# Patient Record
Sex: Male | Born: 1965
Health system: Southern US, Community
[De-identification: ages and names within clinical notes are randomized; demographics above are authoritative.]

## PROBLEM LIST (undated history)

## (undated) DIAGNOSIS — Z87442 Personal history of urinary calculi: Secondary | ICD-10-CM

## (undated) DIAGNOSIS — N200 Calculus of kidney: Secondary | ICD-10-CM

## (undated) DIAGNOSIS — I10 Essential (primary) hypertension: Secondary | ICD-10-CM

## (undated) HISTORY — PX: CHOLECYSTECTOMY: SHX55

## (undated) HISTORY — PX: OTHER SURGICAL HISTORY: SHX169

## (undated) HISTORY — PX: APPENDECTOMY: SHX54

---

## 2005-01-05 ENCOUNTER — Ambulatory Visit (HOSPITAL_COMMUNITY): Admission: RE | Admit: 2005-01-05 | Discharge: 2005-01-05 | Payer: Self-pay | Admitting: Family Medicine

## 2005-01-05 ENCOUNTER — Ambulatory Visit: Payer: Self-pay | Admitting: Family Medicine

## 2005-01-28 ENCOUNTER — Ambulatory Visit: Payer: Self-pay | Admitting: Family Medicine

## 2005-07-15 ENCOUNTER — Ambulatory Visit: Payer: Self-pay | Admitting: Family Medicine

## 2007-11-15 DIAGNOSIS — E669 Obesity, unspecified: Secondary | ICD-10-CM | POA: Insufficient documentation

## 2007-11-15 DIAGNOSIS — F528 Other sexual dysfunction not due to a substance or known physiological condition: Secondary | ICD-10-CM | POA: Insufficient documentation

## 2007-11-15 DIAGNOSIS — I10 Essential (primary) hypertension: Secondary | ICD-10-CM | POA: Insufficient documentation

## 2009-05-13 ENCOUNTER — Encounter: Payer: Self-pay | Admitting: Family Medicine

## 2009-12-25 ENCOUNTER — Ambulatory Visit (HOSPITAL_COMMUNITY): Admission: RE | Admit: 2009-12-25 | Discharge: 2009-12-25 | Payer: Self-pay | Admitting: Family Medicine

## 2010-01-09 ENCOUNTER — Emergency Department (HOSPITAL_COMMUNITY): Admission: EM | Admit: 2010-01-09 | Discharge: 2010-01-09 | Payer: Self-pay | Admitting: Emergency Medicine

## 2010-06-09 NOTE — Letter (Signed)
Summary: Historic Patient File  Historic Patient File   Imported By: Lind Guest 05/13/2009 08:28:59  _____________________________________________________________________  External Attachment:    Type:   Image     Comment:   External Document

## 2012-01-01 IMAGING — CR DG TIBIA/FIBULA 2V*L*
2 series · 2 of 2 positions shown · non-contrast
Comparison: None

CLINICAL DATA: Left lower leg trauma 6 months ago, persistent
swelling

LEFT TIBIA AND FIBULA - 2 VIEW

[view not recorded (1 of 2)]
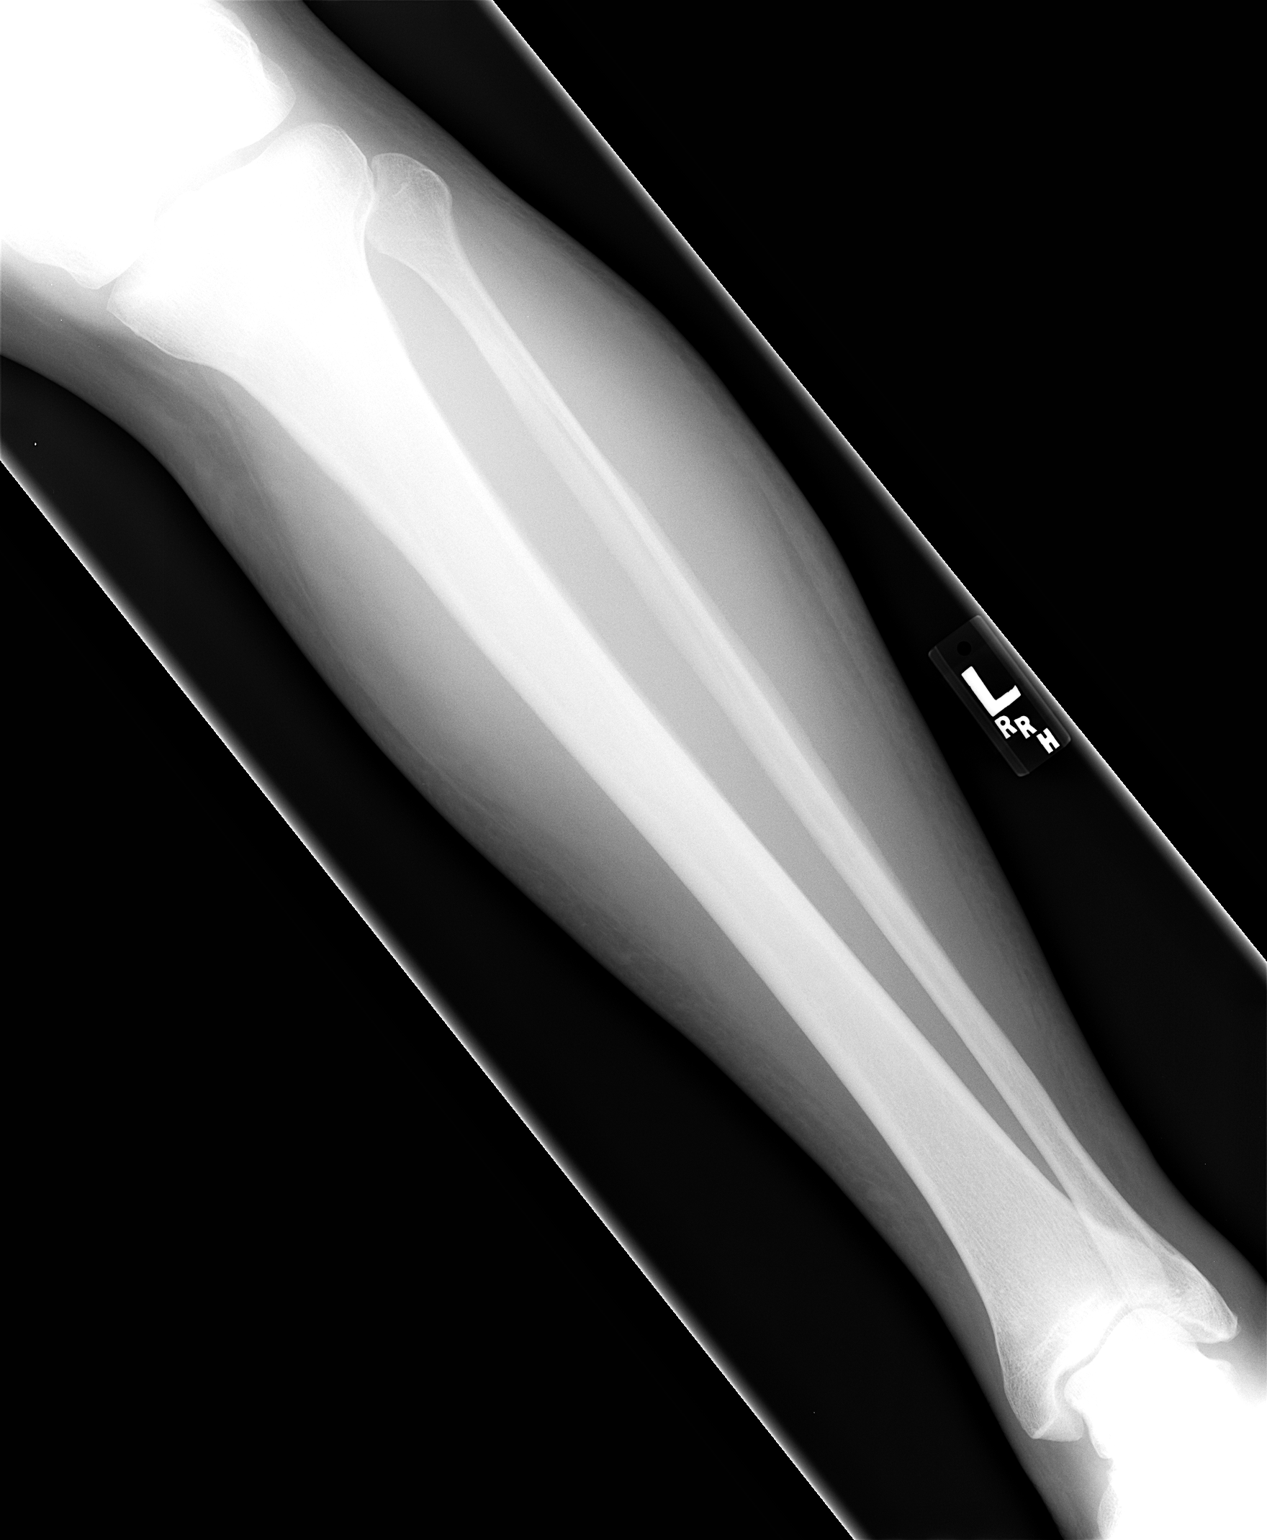

[view not recorded (2 of 2)]
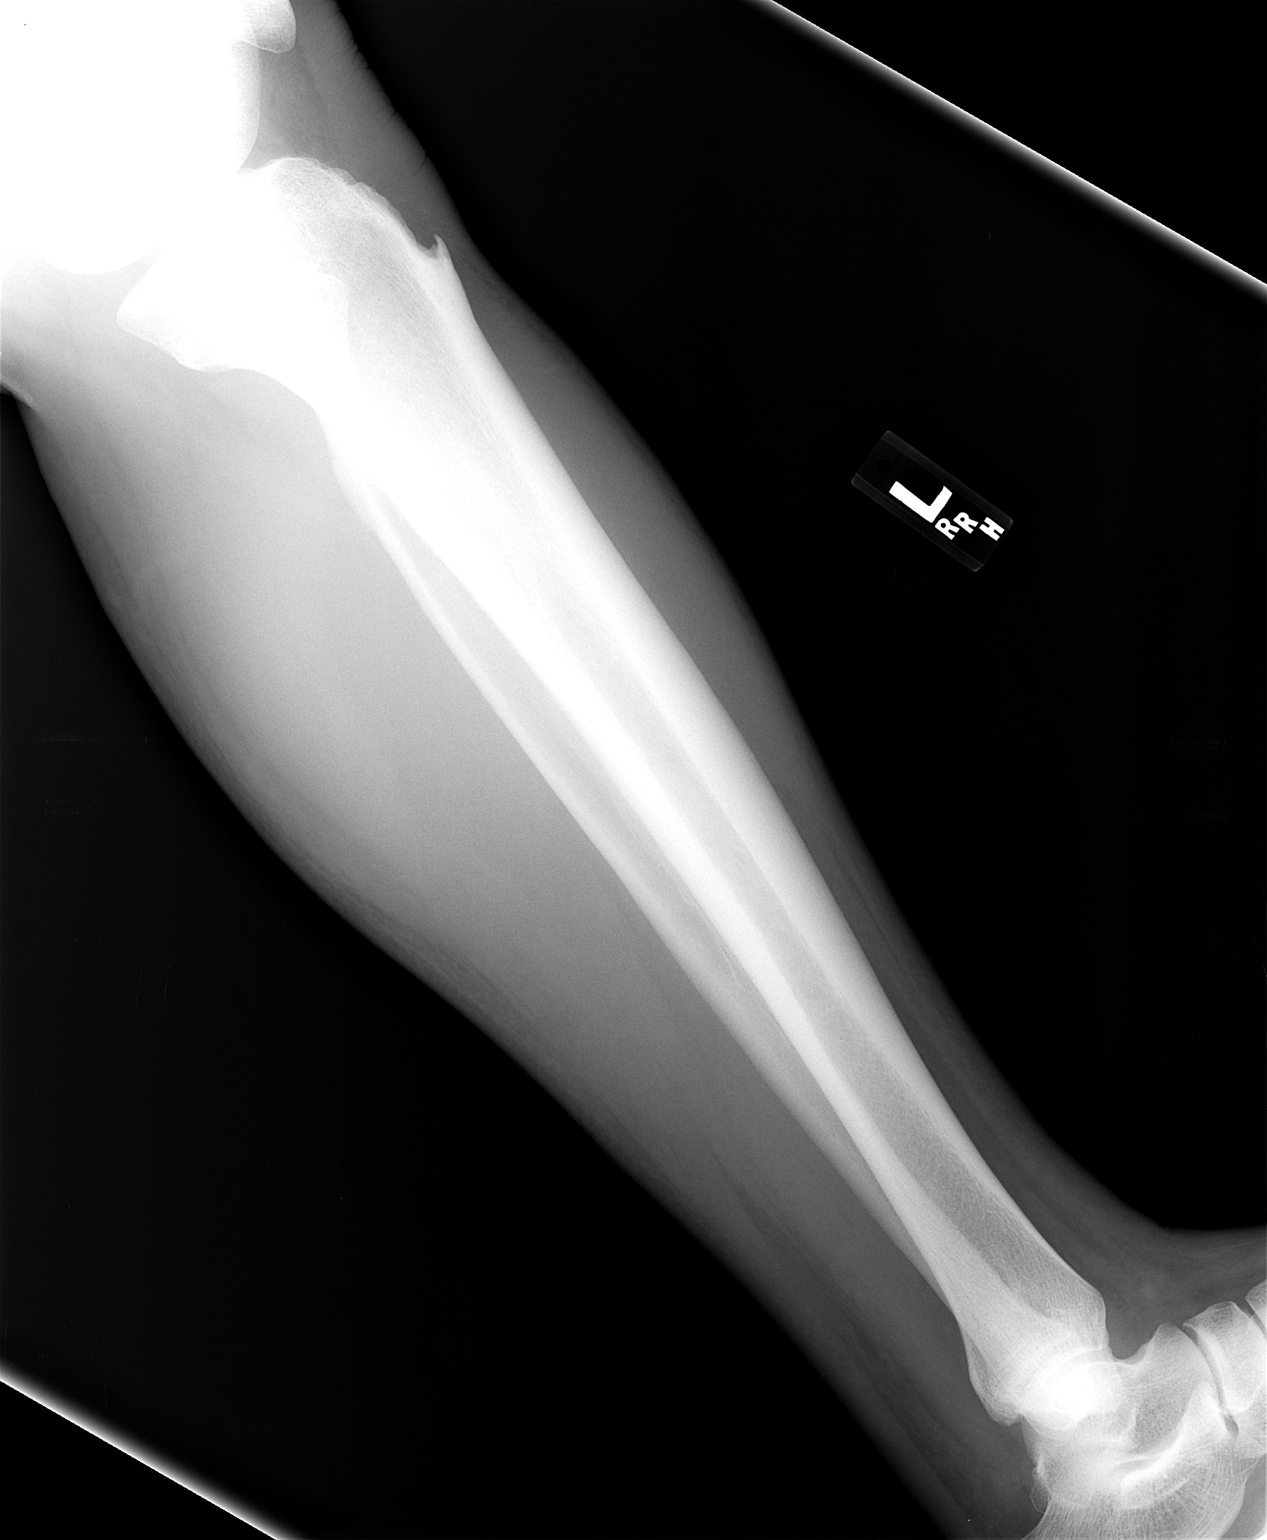

[2 of 2 positions shown; findings below may reference images not displayed]

FINDINGS: Bone mineralization normal.
Joint spaces preserved.
Minimal spurring at tibial tubercle.
No acute fracture, dislocation or bone destruction.
Questionable pretibial soft tissue swelling at proximal to mid left
lower leg.
IMPRESSION: Questionable pretibial soft tissue swelling at proximal to mid left
lower leg.
No acute bony abnormalities.

## 2016-01-16 ENCOUNTER — Ambulatory Visit (INDEPENDENT_AMBULATORY_CARE_PROVIDER_SITE_OTHER): Payer: BLUE CROSS/BLUE SHIELD | Admitting: Urology

## 2016-01-16 DIAGNOSIS — N200 Calculus of kidney: Secondary | ICD-10-CM | POA: Diagnosis not present

## 2016-01-16 DIAGNOSIS — R361 Hematospermia: Secondary | ICD-10-CM | POA: Diagnosis not present

## 2016-01-19 ENCOUNTER — Other Ambulatory Visit: Payer: Self-pay | Admitting: Urology

## 2016-01-19 ENCOUNTER — Ambulatory Visit (HOSPITAL_COMMUNITY)
Admission: RE | Admit: 2016-01-19 | Discharge: 2016-01-19 | Disposition: A | Discharge status: Home / Self Care | Payer: BLUE CROSS/BLUE SHIELD | Source: Ambulatory Visit | Attending: Urology | Admitting: Urology

## 2016-01-19 DIAGNOSIS — N200 Calculus of kidney: Secondary | ICD-10-CM | POA: Diagnosis present

## 2016-02-04 ENCOUNTER — Other Ambulatory Visit: Payer: Self-pay | Admitting: Urology

## 2016-02-16 ENCOUNTER — Ambulatory Visit (HOSPITAL_COMMUNITY): Payer: BLUE CROSS/BLUE SHIELD

## 2016-02-16 ENCOUNTER — Ambulatory Visit (HOSPITAL_COMMUNITY)
Admission: RE | Admit: 2016-02-16 | Discharge: 2016-02-16 | Disposition: A | Discharge status: Home / Self Care | Payer: BLUE CROSS/BLUE SHIELD | Source: Ambulatory Visit | Attending: Urology | Admitting: Urology

## 2016-02-16 ENCOUNTER — Encounter (HOSPITAL_COMMUNITY)
Admission: RE | Disposition: A | Discharge status: Home / Self Care | Payer: Self-pay | Source: Ambulatory Visit | Attending: Urology

## 2016-02-16 ENCOUNTER — Encounter (HOSPITAL_COMMUNITY): Payer: Self-pay | Admitting: *Deleted

## 2016-02-16 DIAGNOSIS — E669 Obesity, unspecified: Secondary | ICD-10-CM | POA: Insufficient documentation

## 2016-02-16 DIAGNOSIS — I10 Essential (primary) hypertension: Secondary | ICD-10-CM | POA: Insufficient documentation

## 2016-02-16 DIAGNOSIS — Z6841 Body Mass Index (BMI) 40.0 and over, adult: Secondary | ICD-10-CM | POA: Diagnosis not present

## 2016-02-16 DIAGNOSIS — N200 Calculus of kidney: Secondary | ICD-10-CM

## 2016-02-16 HISTORY — DX: Essential (primary) hypertension: I10

## 2016-02-16 HISTORY — DX: Personal history of urinary calculi: Z87.442

## 2016-02-16 SURGERY — LITHOTRIPSY, ESWL
Anesthesia: LOCAL | Laterality: Left

## 2016-02-16 MED ORDER — CIPROFLOXACIN HCL 500 MG PO TABS
500.0000 mg | ORAL_TABLET | ORAL | Status: AC
  Administered 2016-02-16: 500 mg via ORAL
  Filled 2016-02-16: qty 1

## 2016-02-16 MED ORDER — DIAZEPAM 5 MG PO TABS
10.0000 mg | ORAL_TABLET | ORAL | Status: AC
  Administered 2016-02-16: 10 mg via ORAL
  Filled 2016-02-16: qty 2

## 2016-02-16 MED ORDER — SODIUM CHLORIDE 0.9 % IV SOLN
INTRAVENOUS | Status: DC
  Administered 2016-02-16: 07:00:00 via INTRAVENOUS

## 2016-02-16 MED ORDER — DIPHENHYDRAMINE HCL 25 MG PO CAPS
25.0000 mg | ORAL_CAPSULE | ORAL | Status: AC
  Administered 2016-02-16: 25 mg via ORAL
  Filled 2016-02-16: qty 1

## 2016-02-16 NOTE — Discharge Instructions (Signed)
Please follow [facility] discharge instructions.  Follow up is 03/05/16 at 2:30pm.

## 2016-03-03 ENCOUNTER — Other Ambulatory Visit: Payer: Self-pay | Admitting: Urology

## 2016-03-03 ENCOUNTER — Ambulatory Visit (HOSPITAL_COMMUNITY)
Admission: RE | Admit: 2016-03-03 | Discharge: 2016-03-03 | Disposition: A | Discharge status: Home / Self Care | Payer: BLUE CROSS/BLUE SHIELD | Source: Ambulatory Visit | Attending: Urology | Admitting: Urology

## 2016-03-03 DIAGNOSIS — N200 Calculus of kidney: Secondary | ICD-10-CM

## 2016-03-05 ENCOUNTER — Ambulatory Visit (INDEPENDENT_AMBULATORY_CARE_PROVIDER_SITE_OTHER): Payer: Self-pay | Admitting: Urology

## 2016-03-05 DIAGNOSIS — N200 Calculus of kidney: Secondary | ICD-10-CM

## 2016-03-09 ENCOUNTER — Other Ambulatory Visit: Payer: Self-pay | Admitting: Urology

## 2016-03-11 ENCOUNTER — Encounter (HOSPITAL_COMMUNITY): Payer: Self-pay | Admitting: *Deleted

## 2016-03-12 NOTE — H&P (Signed)
Urology History and Physical Exam  CC: Kidney stone x HPI: 50 year old male presents for repeat ESL of an 8 mm left UPJ stone. He underwent a first ESL in October. On followup there was inadequate fragmentation/pasage. He presents fpr a retreatment.  PMH: Past Medical History:  Diagnosis Date  . History of kidney stones   . Hypertension     PSH: Past Surgical History:  Procedure Laterality Date  . APPENDECTOMY    . CHOLECYSTECTOMY      Allergies: No Known Allergies  Medications: No prescriptions prior to admission.     Social History: Social History   Social History  . Marital status: Single    Spouse name: N/A  . Number of children: N/A  . Years of education: N/A   Occupational History  . Not on file.   Social History Main Topics  . Smoking status: Never Smoker  . Smokeless tobacco: Never Used  . Alcohol use No  . Drug use: No  . Sexual activity: Not on file   Other Topics Concern  . Not on file   Social History Narrative  . No narrative on file    Family History: History reviewed. No pertinent family history.  Review of Systems: Positive: N/A Negative:  A further 10 point review of systems was negative except what is listed in the HPI.                  Physical Exam: @VITALS2 @ General: No acute distress.  Awake. Head:  Normocephalic.  Atraumatic. ENT:  EOMI.  Mucous membranes moist Neck:  Supple.  No lymphadenopathy. CV:  S1 present. S2 present. Regular rate. Pulmonary: Equal effort bilaterally.  Clear to auscultation bilaterally. Abdomen: Soft.   Non tender to palpation. Skin:  Normal turgor.  No visible rash. Extremity: No gross deformity of bilateral upper extremities.  No gross deformity of                             lower extremities. Neurologic: Alert. Appropriate mood.    Studies:  No results for input(s): HGB, WBC, PLT in the last 72 hours.  No results for input(s): NA, K, CL, CO2, BUN, CREATININE, CALCIUM, GFRNONAA, GFRAA in  the last 72 hours.  Invalid input(s): MAGNESIUM   No results for input(s): INR, APTT in the last 72 hours.  Invalid input(s): PT   Invalid input(s): ABG    Assessment:  8 mm left UPJ stone  Plan: ESL retreat

## 2016-03-15 ENCOUNTER — Encounter (HOSPITAL_COMMUNITY): Payer: Self-pay | Admitting: General Practice

## 2016-03-15 ENCOUNTER — Ambulatory Visit (HOSPITAL_COMMUNITY)
Admission: RE | Admit: 2016-03-15 | Discharge: 2016-03-15 | Disposition: A | Discharge status: Home / Self Care | Payer: BLUE CROSS/BLUE SHIELD | Source: Ambulatory Visit | Attending: Urology | Admitting: Urology

## 2016-03-15 ENCOUNTER — Encounter (HOSPITAL_COMMUNITY)
Admission: RE | Disposition: A | Discharge status: Home / Self Care | Payer: Self-pay | Source: Ambulatory Visit | Attending: Urology

## 2016-03-15 ENCOUNTER — Ambulatory Visit (HOSPITAL_COMMUNITY): Payer: BLUE CROSS/BLUE SHIELD

## 2016-03-15 DIAGNOSIS — E669 Obesity, unspecified: Secondary | ICD-10-CM | POA: Diagnosis not present

## 2016-03-15 DIAGNOSIS — Z6841 Body Mass Index (BMI) 40.0 and over, adult: Secondary | ICD-10-CM | POA: Diagnosis not present

## 2016-03-15 DIAGNOSIS — Z87442 Personal history of urinary calculi: Secondary | ICD-10-CM | POA: Diagnosis not present

## 2016-03-15 DIAGNOSIS — I10 Essential (primary) hypertension: Secondary | ICD-10-CM | POA: Insufficient documentation

## 2016-03-15 DIAGNOSIS — N200 Calculus of kidney: Secondary | ICD-10-CM | POA: Diagnosis present

## 2016-03-15 DIAGNOSIS — N201 Calculus of ureter: Secondary | ICD-10-CM

## 2016-03-15 HISTORY — DX: Calculus of kidney: N20.0

## 2016-03-15 SURGERY — LITHOTRIPSY, ESWL
Anesthesia: LOCAL | Laterality: Left

## 2016-03-15 MED ORDER — DIAZEPAM 5 MG PO TABS
10.0000 mg | ORAL_TABLET | ORAL | Status: AC
  Administered 2016-03-15: 10 mg via ORAL
  Filled 2016-03-15: qty 2

## 2016-03-15 MED ORDER — SODIUM CHLORIDE 0.9 % IV SOLN
INTRAVENOUS | Status: DC
  Administered 2016-03-15: 08:00:00 via INTRAVENOUS

## 2016-03-15 MED ORDER — CIPROFLOXACIN HCL 500 MG PO TABS
500.0000 mg | ORAL_TABLET | ORAL | Status: AC
  Administered 2016-03-15: 500 mg via ORAL
  Filled 2016-03-15: qty 1

## 2016-03-15 MED ORDER — DIPHENHYDRAMINE HCL 25 MG PO CAPS
25.0000 mg | ORAL_CAPSULE | ORAL | Status: AC
  Administered 2016-03-15: 25 mg via ORAL
  Filled 2016-03-15: qty 1

## 2016-03-15 NOTE — Op Note (Signed)
See [facility] OP note scanned into chart. 

## 2016-03-15 NOTE — Discharge Instructions (Signed)
See [facility] discharge instructions in chart.  

## 2016-04-16 ENCOUNTER — Ambulatory Visit (INDEPENDENT_AMBULATORY_CARE_PROVIDER_SITE_OTHER): Payer: Self-pay | Admitting: Urology

## 2016-04-16 ENCOUNTER — Other Ambulatory Visit: Payer: Self-pay | Admitting: Urology

## 2016-04-16 DIAGNOSIS — N200 Calculus of kidney: Secondary | ICD-10-CM

## 2016-04-19 ENCOUNTER — Ambulatory Visit (HOSPITAL_COMMUNITY): Admission: RE | Admit: 2016-04-19 | Payer: BLUE CROSS/BLUE SHIELD | Source: Ambulatory Visit

## 2016-05-21 ENCOUNTER — Ambulatory Visit: Payer: BLUE CROSS/BLUE SHIELD | Admitting: Urology

## 2016-06-28 ENCOUNTER — Ambulatory Visit (HOSPITAL_COMMUNITY)
Admission: RE | Admit: 2016-06-28 | Discharge: 2016-06-28 | Disposition: A | Discharge status: Home / Self Care | Payer: BLUE CROSS/BLUE SHIELD | Source: Ambulatory Visit | Attending: Urology | Admitting: Urology

## 2016-06-28 DIAGNOSIS — N200 Calculus of kidney: Secondary | ICD-10-CM

## 2016-07-02 ENCOUNTER — Ambulatory Visit (INDEPENDENT_AMBULATORY_CARE_PROVIDER_SITE_OTHER): Payer: BLUE CROSS/BLUE SHIELD | Admitting: Urology

## 2016-07-02 DIAGNOSIS — N200 Calculus of kidney: Secondary | ICD-10-CM

## 2016-11-05 ENCOUNTER — Encounter: Payer: Self-pay | Admitting: Family Medicine

## 2016-11-05 ENCOUNTER — Ambulatory Visit (INDEPENDENT_AMBULATORY_CARE_PROVIDER_SITE_OTHER): Payer: BLUE CROSS/BLUE SHIELD | Admitting: Family Medicine

## 2016-11-05 VITALS — BP 178/128 | HR 88 | Temp 98.0°F | Resp 20 | Ht 67.0 in | Wt 264.1 lb

## 2016-11-05 DIAGNOSIS — N200 Calculus of kidney: Secondary | ICD-10-CM | POA: Diagnosis not present

## 2016-11-05 DIAGNOSIS — Z6841 Body Mass Index (BMI) 40.0 and over, adult: Secondary | ICD-10-CM | POA: Diagnosis not present

## 2016-11-05 DIAGNOSIS — I10 Essential (primary) hypertension: Secondary | ICD-10-CM | POA: Diagnosis not present

## 2016-11-05 DIAGNOSIS — Z1211 Encounter for screening for malignant neoplasm of colon: Secondary | ICD-10-CM

## 2016-11-05 DIAGNOSIS — F528 Other sexual dysfunction not due to a substance or known physiological condition: Secondary | ICD-10-CM | POA: Diagnosis not present

## 2016-11-05 DIAGNOSIS — R361 Hematospermia: Secondary | ICD-10-CM | POA: Diagnosis not present

## 2016-11-05 HISTORY — DX: Calculus of kidney: N20.0

## 2016-11-05 LAB — CBC
HEMATOCRIT: 47.1 % (ref 38.5–50.0)
Hemoglobin: 15.2 g/dL (ref 13.2–17.1)
MCH: 28.8 pg (ref 27.0–33.0)
MCHC: 32.3 g/dL (ref 32.0–36.0)
MCV: 89.2 fL (ref 80.0–100.0)
MPV: 10.1 fL (ref 7.5–12.5)
PLATELETS: 209 10*3/uL (ref 140–400)
RBC: 5.28 MIL/uL (ref 4.20–5.80)
RDW: 14.2 % (ref 11.0–15.0)
WBC: 8.6 10*3/uL (ref 3.8–10.8)

## 2016-11-05 LAB — PSA: PSA: 0.4 ng/mL (ref <=4.0)

## 2016-11-05 MED ORDER — AMLODIPINE BESYLATE 5 MG PO TABS: 5.0000 mg | ORAL_TABLET | Freq: Every day | ORAL | 3 refills | Status: DC

## 2016-11-05 MED ORDER — LOSARTAN POTASSIUM-HCTZ 50-12.5 MG PO TABS: 1.0000 | ORAL_TABLET | Freq: Every day | ORAL | 3 refills | Status: DC

## 2016-11-05 NOTE — Progress Notes (Signed)
Chief Complaint  Patient presents with  . Hypertension   Has not seen a PCP recently Out of medicines Sees urology Dr Jeffie Pollock for kidney stones, ED and recently blood in semen No headache or dizziness off of the BP medicine - although his BP is high Didn't like prior meds due to cough on lisinopril, and his dentist told him to get off the atenolol.  Reason?Marland Kitchen No recent PE Never had colon cancer screen Discussed pros and cons of prostate cancer screening and he chooses to have pSA No recent eye or dental care Discussed his weight.  He would like to lose 60 lbs.  We reviewed diet, portions. calories and snacking.  recommend regular exercise.   Patient Active Problem List   Diagnosis Date Noted  . Kidney stones 11/05/2016  . OBESITY 11/15/2007  . ERECTILE DYSFUNCTION 11/15/2007  . Essential hypertension 11/15/2007    Outpatient Encounter Prescriptions as of 11/05/2016  Medication Sig  . amLODipine (NORVASC) 5 MG tablet Take 1 tablet (5 mg total) by mouth daily.  Marland Kitchen losartan-hydrochlorothiazide (HYZAAR) 50-12.5 MG tablet Take 1 tablet by mouth daily.   No facility-administered encounter medications on file as of 11/05/2016.     Past Medical History:  Diagnosis Date  . History of kidney stones   . Hypertension   . Kidney stones     Past Surgical History:  Procedure Laterality Date  . APPENDECTOMY    . CHOLECYSTECTOMY    . lithrotripsy      Social History   Social History  . Marital status: Significant Other    Spouse name: Pam  . Number of children: 1  . Years of education: 81   Occupational History  . Engineer, building services    Social History Main Topics  . Smoking status: Never Smoker  . Smokeless tobacco: Never Used  . Alcohol use No  . Drug use: No  . Sexual activity: Yes    Birth control/ protection: None   Other Topics Concern  . 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 COPD Mother   . Hypertension Mother   . Alcohol abuse Mother   . Asthma Mother   . Heart disease Mother        died at 36  . Hypertension Father   . Stroke Sister   . Alcohol abuse Sister   . Cancer Brother   . Early death Brother        homicide    Review of Systems  Constitutional: Negative for chills, fever and weight loss.  HENT: Negative for congestion and hearing loss.   Eyes: Negative for blurred vision and pain.  Respiratory: Negative for cough and shortness of breath.   Cardiovascular: Negative for chest pain and leg swelling.  Gastrointestinal: Negative for abdominal pain, constipation, diarrhea and heartburn.  Genitourinary: Negative for dysuria and frequency.  Musculoskeletal: Negative for falls, joint pain and myalgias.  Neurological: Negative for dizziness, seizures and headaches.  Psychiatric/Behavioral: Negative for depression. The patient is not nervous/anxious and does not have insomnia.     BP (!) 178/128 (BP Location: Right Arm, Patient Position: Sitting, Cuff Size: Large)   Pulse 88   Temp 98 F (36.7 C) (Temporal)   Resp 20   Ht 5\' 7"  (1.702 m)   Wt 264  lb 1.9 oz (119.8 kg)   SpO2 95%   BMI 41.37 kg/m   Physical Exam  Constitutional: He is oriented to person, place, and time. He appears well-developed and well-nourished.  HENT:  Head: Normocephalic and atraumatic.  Mouth/Throat: Oropharynx is clear and moist.  Eyes: Conjunctivae are normal. Pupils are equal, round, and reactive to light.  Neck: Normal range of motion. Neck supple.  Cardiovascular: Normal rate, regular rhythm and normal heart sounds.   Pulmonary/Chest: Effort normal and breath sounds normal. No respiratory distress.  Abdominal:  obese  Musculoskeletal: Normal range of motion. He exhibits no edema.  Neurological: He is alert and oriented to person, place, and time.  Gait normal  Skin:  Skin is warm and dry.  Psychiatric: He has a normal mood and affect. His behavior is normal. Thought content normal.  Nursing note and vitals reviewed.  ASSESSMENT/PLAN:  1. Kidney stones  2. Essential hypertension - CBC - COMPLETE METABOLIC PANEL WITH GFR - Hemoglobin A1c - Lipid panel - VITAMIN D 25 Hydroxy (Vit-D Deficiency, Fractures) - Urinalysis, Routine w reflex microscopic  3. ERECTILE DYSFUNCTION  4. Class 3 severe obesity due to excess calories with serious comorbidity and body mass index (BMI) of 40.0 to 44.9 in adult [facility]) educated  5. Hematospermia - PSA  6. Colon cancer screening - Ambulatory referral to Gastroenterology   Greater than 50% of this visit was spent in counseling and coordinating care.  Total face to face time:  46min in health education re obesity and the importance of BP medication compliance  Patient Instructions  Take the BP medicines daily Drink more water Can drink flavored water or flavored seltzer Walk every day tht you are aable  need blood work today  Need colon cancer screening  Come back for BP check in a week or two See me for a PE in a month   Raylene Everts, MD

## 2016-11-05 NOTE — Patient Instructions (Addendum)
Take the BP medicines daily Drink more water Can drink flavored water or flavored seltzer Walk every day tht you are aable  need blood work today  Need colon cancer screening  Come back for BP check in a week or two See me for a PE in a month

## 2016-11-06 LAB — LIPID PANEL
CHOL/HDL RATIO: 2.5 ratio (ref <5.0)
Cholesterol: 133 mg/dL (ref <200)
HDL: 54 mg/dL (ref >40)
LDL CALC: 69 mg/dL (ref <100)
Triglycerides: 50 mg/dL (ref <150)
VLDL: 10 mg/dL (ref <30)

## 2016-11-06 LAB — COMPLETE METABOLIC PANEL WITH GFR
ALT: 26 U/L (ref 9–46)
AST: 17 U/L (ref 10–35)
Albumin: 4.3 g/dL (ref 3.6–5.1)
Alkaline Phosphatase: 77 U/L (ref 40–115)
BILIRUBIN TOTAL: 0.4 mg/dL (ref 0.2–1.2)
BUN: 15 mg/dL (ref 7–25)
CO2: 29 mmol/L (ref 20–31)
CREATININE: 1.11 mg/dL (ref 0.70–1.33)
Calcium: 9.1 mg/dL (ref 8.6–10.3)
Chloride: 102 mmol/L (ref 98–110)
GFR, EST AFRICAN AMERICAN: 88 mL/min (ref >=60)
GFR, Est Non African American: 76 mL/min (ref >=60)
Glucose, Bld: 85 mg/dL (ref 65–99)
Potassium: 4.3 mmol/L (ref 3.5–5.3)
Sodium: 138 mmol/L (ref 135–146)
TOTAL PROTEIN: 7.1 g/dL (ref 6.1–8.1)

## 2016-11-06 LAB — URINALYSIS, ROUTINE W REFLEX MICROSCOPIC
BILIRUBIN URINE: NEGATIVE
Glucose, UA: NEGATIVE
HGB URINE DIPSTICK: NEGATIVE
KETONES UR: NEGATIVE
Leukocytes, UA: NEGATIVE
Nitrite: NEGATIVE
Specific Gravity, Urine: 1.021 (ref 1.001–1.035)
pH: 6 (ref 5.0–8.0)

## 2016-11-06 LAB — HEMOGLOBIN A1C
Hgb A1c MFr Bld: 5.7 % — ABNORMAL HIGH (ref <5.7)
MEAN PLASMA GLUCOSE: 117 mg/dL

## 2016-11-06 LAB — VITAMIN D 25 HYDROXY (VIT D DEFICIENCY, FRACTURES): VIT D 25 HYDROXY: 25 ng/mL — AB (ref 30–100)

## 2016-11-08 ENCOUNTER — Encounter (INDEPENDENT_AMBULATORY_CARE_PROVIDER_SITE_OTHER): Payer: Self-pay | Admitting: *Deleted

## 2016-11-11 ENCOUNTER — Encounter: Payer: Self-pay | Admitting: Family Medicine

## 2016-11-19 ENCOUNTER — Ambulatory Visit: Payer: BLUE CROSS/BLUE SHIELD

## 2016-11-19 VITALS — BP 138/96

## 2016-11-19 DIAGNOSIS — I10 Essential (primary) hypertension: Secondary | ICD-10-CM

## 2016-11-19 NOTE — Progress Notes (Signed)
Patient took BP meds roughly two hours prior to BP check, but did have Dr Malachi Bonds right before coming in.

## 2016-12-07 ENCOUNTER — Ambulatory Visit (INDEPENDENT_AMBULATORY_CARE_PROVIDER_SITE_OTHER): Payer: BLUE CROSS/BLUE SHIELD | Admitting: Family Medicine

## 2016-12-07 ENCOUNTER — Other Ambulatory Visit: Payer: Self-pay

## 2016-12-07 ENCOUNTER — Encounter: Payer: Self-pay | Admitting: Family Medicine

## 2016-12-07 VITALS — BP 138/88 | HR 100 | Temp 97.7°F | Resp 16 | Ht 66.5 in | Wt 266.2 lb

## 2016-12-07 DIAGNOSIS — I10 Essential (primary) hypertension: Secondary | ICD-10-CM

## 2016-12-07 DIAGNOSIS — E559 Vitamin D deficiency, unspecified: Secondary | ICD-10-CM | POA: Diagnosis not present

## 2016-12-07 DIAGNOSIS — R7303 Prediabetes: Secondary | ICD-10-CM

## 2016-12-07 DIAGNOSIS — Z0000 Encounter for general adult medical examination without abnormal findings: Secondary | ICD-10-CM

## 2016-12-07 DIAGNOSIS — S61501A Unspecified open wound of right wrist, initial encounter: Secondary | ICD-10-CM

## 2016-12-07 NOTE — Patient Instructions (Addendum)
You need to reduce the sweets and carbohydrates in your diet I will check you sugar in 6 months Try to lose weight 1-2 pounds a week Walk every day that you are able We will discuss nutrition consult if you are unable to lose weight on your own        DASH Eating Plan DASH stands for "Dietary Approaches to Stop Hypertension." The DASH eating plan is a healthy eating plan that has been shown to reduce high blood pressure (hypertension). It may also reduce your risk for type 2 diabetes, heart disease, and stroke. The DASH eating plan may also help with weight loss. What are tips for following this plan? General guidelines  Avoid eating more than 2,300 mg (milligrams) of salt (sodium) a day. If you have hypertension, you may need to reduce your sodium intake to 1,500 mg a day.  Limit alcohol intake to no more than 1 drink a day for nonpregnant women and 2 drinks a day for men. One drink equals 12 oz of beer, 5 oz of wine, or 1 oz of hard liquor.  Work with your health care provider to maintain a healthy body weight or to lose weight. Ask what an ideal weight is for you.  Get at least 30 minutes of exercise that causes your heart to beat faster (aerobic exercise) most days of the week. Activities may include walking, swimming, or biking.  Work with your health care provider or diet and nutrition specialist (dietitian) to adjust your eating plan to your individual calorie needs. Reading food labels  Check food labels for the amount of sodium per serving. Choose foods with less than 5 percent of the Daily Value of sodium. Generally, foods with less than 300 mg of sodium per serving fit into this eating plan.  To find whole grains, look for the word "whole" as the first word in the ingredient list. Shopping  Buy products labeled as "low-sodium" or "no salt added."  Buy fresh foods. Avoid canned foods and premade or frozen meals. Cooking  Avoid adding salt when cooking. Use salt-free  seasonings or herbs instead of table salt or sea salt. Check with your health care provider or pharmacist before using salt substitutes.  Do not fry foods. Cook foods using healthy methods such as baking, boiling, grilling, and broiling instead.  Cook with heart-healthy oils, such as olive, canola, soybean, or sunflower oil. Meal planning   Eat a balanced diet that includes: ? 5 or more servings of fruits and vegetables each day. At each meal, try to fill half of your plate with fruits and vegetables. ? Up to 6-8 servings of whole grains each day. ? Less than 6 oz of lean meat, poultry, or fish each day. A 3-oz serving of meat is about the same size as a deck of cards. One egg equals 1 oz. ? 2 servings of low-fat dairy each day. ? A serving of nuts, seeds, or beans 5 times each week. ? Heart-healthy fats. Healthy fats called Omega-3 fatty acids are found in foods such as flaxseeds and coldwater fish, like sardines, salmon, and mackerel.  Limit how much you eat of the following: ? Canned or prepackaged foods. ? Food that is high in trans fat, such as fried foods. ? Food that is high in saturated fat, such as fatty meat. ? Sweets, desserts, sugary drinks, and other foods with added sugar. ? Full-fat dairy products.  Do not salt foods before eating.  Try to eat at least  2 vegetarian meals each week.  Eat more home-cooked food and less restaurant, buffet, and fast food.  When eating at a restaurant, ask that your food be prepared with less salt or no salt, if possible. What foods are recommended? The items listed may not be a complete list. Talk with your dietitian about what dietary choices are best for you. Grains Whole-grain or whole-wheat bread. Whole-grain or whole-wheat pasta. Brown rice. [place]. Bulgur. Whole-grain and low-sodium cereals. Pita bread. Low-fat, low-sodium crackers. Whole-wheat flour tortillas. Vegetables Fresh or frozen vegetables (raw, steamed, roasted,  or grilled). Low-sodium or reduced-sodium tomato and vegetable juice. Low-sodium or reduced-sodium tomato sauce and tomato paste. Low-sodium or reduced-sodium canned vegetables. Fruits All fresh, dried, or frozen fruit. Canned fruit in natural juice (without added sugar). Meat and other protein foods Skinless chicken or [place]. Ground chicken or [place]. Pork with fat trimmed off. Fish and seafood. Egg whites. Dried beans, peas, or lentils. Unsalted nuts, nut butters, and seeds. Unsalted canned beans. Lean cuts of beef with fat trimmed off. Low-sodium, lean deli meat. Dairy Low-fat (1%) or fat-free (skim) milk. Fat-free, low-fat, or reduced-fat cheeses. Nonfat, low-sodium ricotta or cottage cheese. Low-fat or nonfat yogurt. Low-fat, low-sodium cheese. Fats and oils Soft margarine without trans fats. Vegetable oil. Low-fat, reduced-fat, or light mayonnaise and salad dressings (reduced-sodium). Canola, safflower, olive, soybean, and sunflower oils. Avocado. Seasoning and other foods Herbs. Spices. Seasoning mixes without salt. Unsalted popcorn and pretzels. Fat-free sweets. What foods are not recommended? The items listed may not be a complete list. Talk with your dietitian about what dietary choices are best for you. Grains Baked goods made with fat, such as croissants, muffins, or some breads. Dry pasta or rice meal packs. Vegetables Creamed or fried vegetables. Vegetables in a cheese sauce. Regular canned vegetables (not low-sodium or reduced-sodium). Regular canned tomato sauce and paste (not low-sodium or reduced-sodium). Regular tomato and vegetable juice (not low-sodium or reduced-sodium). Angie Fava. Olives. Fruits Canned fruit in a light or heavy syrup. Fried fruit. Fruit in cream or butter sauce. Meat and other protein foods Fatty cuts of meat. Ribs. Fried meat. Berniece Salines. Sausage. Bologna and other processed lunch meats. Salami. Fatback. Hotdogs. Bratwurst. Salted nuts and seeds. Canned beans  with added salt. Canned or smoked fish. Whole eggs or egg yolks. Chicken or [place] with skin. Dairy Whole or 2% milk, cream, and half-and-half. Whole or full-fat cream cheese. Whole-fat or sweetened yogurt. Full-fat cheese. Nondairy creamers. Whipped toppings. Processed cheese and cheese spreads. Fats and oils Butter. Stick margarine. Lard. Shortening. Ghee. Bacon fat. Tropical oils, such as coconut, palm kernel, or palm oil. Seasoning and other foods Salted popcorn and pretzels. Onion salt, garlic salt, seasoned salt, table salt, and sea salt. Worcestershire sauce. Tartar sauce. Barbecue sauce. Teriyaki sauce. Soy sauce, including reduced-sodium. Steak sauce. Canned and packaged gravies. Fish sauce. Oyster sauce. Cocktail sauce. Horseradish that you find on the shelf. Ketchup. Mustard. Meat flavorings and tenderizers. Bouillon cubes. Hot sauce and Tabasco sauce. Premade or packaged marinades. Premade or packaged taco seasonings. Relishes. Regular salad dressings. Where to find more information:  National Heart, Lung, and [place]: https://wilson-eaton.com/  American Heart Association: www.heart.org Summary  The DASH eating plan is a healthy eating plan that has been shown to reduce high blood pressure (hypertension). It may also reduce your risk for type 2 diabetes, heart disease, and stroke.  With the DASH eating plan, you should limit salt (sodium) intake to 2,300 mg a day. If you have hypertension, you may  need to reduce your sodium intake to 1,500 mg a day.  When on the DASH eating plan, aim to eat more fresh fruits and vegetables, whole grains, lean proteins, low-fat dairy, and heart-healthy fats.  Work with your health care provider or diet and nutrition specialist (dietitian) to adjust your eating plan to your individual calorie needs. This information is not intended to replace advice given to you by your health care provider. Make sure you discuss any questions you have with your health  care provider. Document Released: 04/15/2011 Document Revised: 04/19/2016 Document Reviewed: 04/19/2016 Elsevier Interactive Patient Education  2017 [employer].

## 2016-12-07 NOTE — Progress Notes (Signed)
Chief Complaint  Patient presents with  . Annual Exam   Here for PE He will call for colonoscopy, he has letter from GI Recent labs reviewed.  Cholesterol reviewed.  Has pre diabetes.  Discussed diet, sweets, carbohydrates and portions.  Reviewed the importance of exercising every day.   BP controlled   Patient Active Problem List   Diagnosis Date Noted  . Prediabetes 12/07/2016  . Vitamin D deficiency 12/07/2016  . Kidney stones 11/05/2016  . OBESITY 11/15/2007  . ERECTILE DYSFUNCTION 11/15/2007  . Essential hypertension 11/15/2007    Outpatient Encounter Prescriptions as of 12/07/2016  Medication Sig  . amLODipine (NORVASC) 5 MG tablet Take 1 tablet (5 mg total) by mouth daily.  Marland Kitchen losartan-hydrochlorothiazide (HYZAAR) 50-12.5 MG tablet Take 1 tablet by mouth daily.   No facility-administered encounter medications on file as of 12/07/2016.     Allergies  Allergen Reactions  . Lisinopril Cough    Review of Systems  Constitutional: Negative for activity change, appetite change and unexpected weight change.  HENT: Negative for congestion, dental problem and trouble swallowing.   Eyes: Negative for photophobia and visual disturbance.  Respiratory: Negative for cough, shortness of breath and wheezing.   Cardiovascular: Negative for chest pain, palpitations and leg swelling.  Gastrointestinal: Negative for constipation and diarrhea.  Genitourinary: Negative for difficulty urinating and frequency.  Musculoskeletal: Negative for arthralgias and back pain.  Neurological: Negative for dizziness and headaches.  Psychiatric/Behavioral: Negative for sleep disturbance. The patient is not nervous/anxious.    Physical Exam  BP 138/88   Pulse 100   Temp 97.7 F (36.5 C) (Other (Comment))   Resp 16   Ht 5' 6.5" (1.689 m)   Wt 266 lb 4 oz (120.8 kg)   SpO2 94%   BMI 42.33 kg/m   General Appearance:    Alert, cooperative, no distress, appears stated age.  obese  Head:     Normocephalic, without obvious abnormality, atraumatic  Eyes:    PERRL, conjunctiva/corneas clear, EOM's intact, fundi    benign, both eyes       Ears:    Normal TM's and external ear canals, both ears  Nose:   Nares normal, septum midline, mucosa normal, no drainage   or sinus tenderness  Throat:   Lips, mucosa, and tongue normal; teeth and gums normal  Neck:   Supple, symmetrical, trachea midline, no adenopathy;       thyroid:  No enlargement/tenderness/nodules; no carotid   bruit   Back:     Symmetric, no curvature, ROM normal, no CVA tenderness  Lungs:     Clear to auscultation bilaterally, respirations unlabored  Chest wall:    No tenderness or deformity  Heart:    Regular rate and rhythm, S1 and S2 normal, no murmur, rub   or gallop  Abdomen:     Soft, non-tender, bowel sounds active all four quadrants,    no masses, no organomegaly. Obese.  Well healed appy scar  Genitalia:    Normal male without lesion, discharge or tenderness  Extremities:   Extremities normal, atraumatic, no cyanosis or edema  Pulses:   2+ and symmetric all extremities  Skin:   Skin color, texture, turgor normal, no rashes or lesions.  Skin peeling feet ?tinea  Lymph nodes:   Cervical, supraclavicular, and axillary nodes normal  Neurologic:   Normal strength, sensation and reflexes      throughout     ASSESSMENT/PLAN:  1. Prediabetes DIET - Hemoglobin A1c  2.  Vitamin D deficiency discussed  3. Essential hypertension No LVH - EKG 12-Lead  4. Annual physical exam complete  5. Open wound of right wrist, initial encounter Need Tdap  Patient Instructions  You need to reduce the sweets and carbohydrates in your diet I will check you sugar in 6 months Try to lose weight 1-2 pounds a week Walk every day that you are able We will discuss nutrition consult if you are unable to lose weight on your own        DASH Eating Plan DASH stands for "Dietary Approaches to Stop Hypertension." The DASH  eating plan is a healthy eating plan that has been shown to reduce high blood pressure (hypertension). It may also reduce your risk for type 2 diabetes, heart disease, and stroke. The DASH eating plan may also help with weight loss. What are tips for following this plan? General guidelines  Avoid eating more than 2,300 mg (milligrams) of salt (sodium) a day. If you have hypertension, you may need to reduce your sodium intake to 1,500 mg a day.  Limit alcohol intake to no more than 1 drink a day for nonpregnant women and 2 drinks a day for men. One drink equals 12 oz of beer, 5 oz of wine, or 1 oz of hard liquor.  Work with your health care provider to maintain a healthy body weight or to lose weight. Ask what an ideal weight is for you.  Get at least 30 minutes of exercise that causes your heart to beat faster (aerobic exercise) most days of the week. Activities may include walking, swimming, or biking.  Work with your health care provider or diet and nutrition specialist (dietitian) to adjust your eating plan to your individual calorie needs. Reading food labels  Check food labels for the amount of sodium per serving. Choose foods with less than 5 percent of the Daily Value of sodium. Generally, foods with less than 300 mg of sodium per serving fit into this eating plan.  To find whole grains, look for the word "whole" as the first word in the ingredient list. Shopping  Buy products labeled as "low-sodium" or "no salt added."  Buy fresh foods. Avoid canned foods and premade or frozen meals. Cooking  Avoid adding salt when cooking. Use salt-free seasonings or herbs instead of table salt or sea salt. Check with your health care provider or pharmacist before using salt substitutes.  Do not fry foods. Cook foods using healthy methods such as baking, boiling, grilling, and broiling instead.  Cook with heart-healthy oils, such as olive, canola, soybean, or sunflower oil. Meal  planning   Eat a balanced diet that includes: ? 5 or more servings of fruits and vegetables each day. At each meal, try to fill half of your plate with fruits and vegetables. ? Up to 6-8 servings of whole grains each day. ? Less than 6 oz of lean meat, poultry, or fish each day. A 3-oz serving of meat is about the same size as a deck of cards. One egg equals 1 oz. ? 2 servings of low-fat dairy each day. ? A serving of nuts, seeds, or beans 5 times each week. ? Heart-healthy fats. Healthy fats called Omega-3 fatty acids are found in foods such as flaxseeds and coldwater fish, like sardines, salmon, and mackerel.  Limit how much you eat of the following: ? Canned or prepackaged foods. ? Food that is high in trans fat, such as fried foods. ? Food that is high  in saturated fat, such as fatty meat. ? Sweets, desserts, sugary drinks, and other foods with added sugar. ? Full-fat dairy products.  Do not salt foods before eating.  Try to eat at least 2 vegetarian meals each week.  Eat more home-cooked food and less restaurant, buffet, and fast food.  When eating at a restaurant, ask that your food be prepared with less salt or no salt, if possible. What foods are recommended? The items listed may not be a complete list. Talk with your dietitian about what dietary choices are best for you. Grains Whole-grain or whole-wheat bread. Whole-grain or whole-wheat pasta. Brown rice. [place]. Bulgur. Whole-grain and low-sodium cereals. Pita bread. Low-fat, low-sodium crackers. Whole-wheat flour tortillas. Vegetables Fresh or frozen vegetables (raw, steamed, roasted, or grilled). Low-sodium or reduced-sodium tomato and vegetable juice. Low-sodium or reduced-sodium tomato sauce and tomato paste. Low-sodium or reduced-sodium canned vegetables. Fruits All fresh, dried, or frozen fruit. Canned fruit in natural juice (without added sugar). Meat and other protein foods Skinless chicken or [place].  Ground chicken or [place]. Pork with fat trimmed off. Fish and seafood. Egg whites. Dried beans, peas, or lentils. Unsalted nuts, nut butters, and seeds. Unsalted canned beans. Lean cuts of beef with fat trimmed off. Low-sodium, lean deli meat. Dairy Low-fat (1%) or fat-free (skim) milk. Fat-free, low-fat, or reduced-fat cheeses. Nonfat, low-sodium ricotta or cottage cheese. Low-fat or nonfat yogurt. Low-fat, low-sodium cheese. Fats and oils Soft margarine without trans fats. Vegetable oil. Low-fat, reduced-fat, or light mayonnaise and salad dressings (reduced-sodium). Canola, safflower, olive, soybean, and sunflower oils. Avocado. Seasoning and other foods Herbs. Spices. Seasoning mixes without salt. Unsalted popcorn and pretzels. Fat-free sweets. What foods are not recommended? The items listed may not be a complete list. Talk with your dietitian about what dietary choices are best for you. Grains Baked goods made with fat, such as croissants, muffins, or some breads. Dry pasta or rice meal packs. Vegetables Creamed or fried vegetables. Vegetables in a cheese sauce. Regular canned vegetables (not low-sodium or reduced-sodium). Regular canned tomato sauce and paste (not low-sodium or reduced-sodium). Regular tomato and vegetable juice (not low-sodium or reduced-sodium). Angie Fava. Olives. Fruits Canned fruit in a light or heavy syrup. Fried fruit. Fruit in cream or butter sauce. Meat and other protein foods Fatty cuts of meat. Ribs. Fried meat. Berniece Salines. Sausage. Bologna and other processed lunch meats. Salami. Fatback. Hotdogs. Bratwurst. Salted nuts and seeds. Canned beans with added salt. Canned or smoked fish. Whole eggs or egg yolks. Chicken or [place] with skin. Dairy Whole or 2% milk, cream, and half-and-half. Whole or full-fat cream cheese. Whole-fat or sweetened yogurt. Full-fat cheese. Nondairy creamers. Whipped toppings. Processed cheese and cheese spreads. Fats and oils Butter. Stick  margarine. Lard. Shortening. Ghee. Bacon fat. Tropical oils, such as coconut, palm kernel, or palm oil. Seasoning and other foods Salted popcorn and pretzels. Onion salt, garlic salt, seasoned salt, table salt, and sea salt. Worcestershire sauce. Tartar sauce. Barbecue sauce. Teriyaki sauce. Soy sauce, including reduced-sodium. Steak sauce. Canned and packaged gravies. Fish sauce. Oyster sauce. Cocktail sauce. Horseradish that you find on the shelf. Ketchup. Mustard. Meat flavorings and tenderizers. Bouillon cubes. Hot sauce and Tabasco sauce. Premade or packaged marinades. Premade or packaged taco seasonings. Relishes. Regular salad dressings. Where to find more information:  National Heart, Lung, and [place]: https://wilson-eaton.com/  American Heart Association: www.heart.org Summary  The DASH eating plan is a healthy eating plan that has been shown to reduce high blood pressure (hypertension). It may  also reduce your risk for type 2 diabetes, heart disease, and stroke.  With the DASH eating plan, you should limit salt (sodium) intake to 2,300 mg a day. If you have hypertension, you may need to reduce your sodium intake to 1,500 mg a day.  When on the DASH eating plan, aim to eat more fresh fruits and vegetables, whole grains, lean proteins, low-fat dairy, and heart-healthy fats.  Work with your health care provider or diet and nutrition specialist (dietitian) to adjust your eating plan to your individual calorie needs. This information is not intended to replace advice given to you by your health care provider. Make sure you discuss any questions you have with your health care provider. Document Released: 04/15/2011 Document Revised: 04/19/2016 Document Reviewed: 04/19/2016 Elsevier Interactive Patient Education  2017 Elsevier Inc.      Raylene Everts, MD

## 2016-12-10 ENCOUNTER — Ambulatory Visit: Payer: BLUE CROSS/BLUE SHIELD | Admitting: Urology

## 2017-06-08 ENCOUNTER — Ambulatory Visit: Payer: BLUE CROSS/BLUE SHIELD | Admitting: Family Medicine

## 2017-07-18 ENCOUNTER — Encounter: Payer: Self-pay | Admitting: Family Medicine

## 2017-11-27 ENCOUNTER — Other Ambulatory Visit: Payer: Self-pay | Admitting: Family Medicine

## 2018-02-22 IMAGING — CR DG ABDOMEN 1V
2 series · 2 of 2 positions shown · non-contrast
Comparison: Radiographs 01/19/2016

CLINICAL DATA: Left nephrolithiasis pre lithotripsy.

EXAM:
ABDOMEN - 1 VIEW

[t abdomen supine * (1 of 2)]
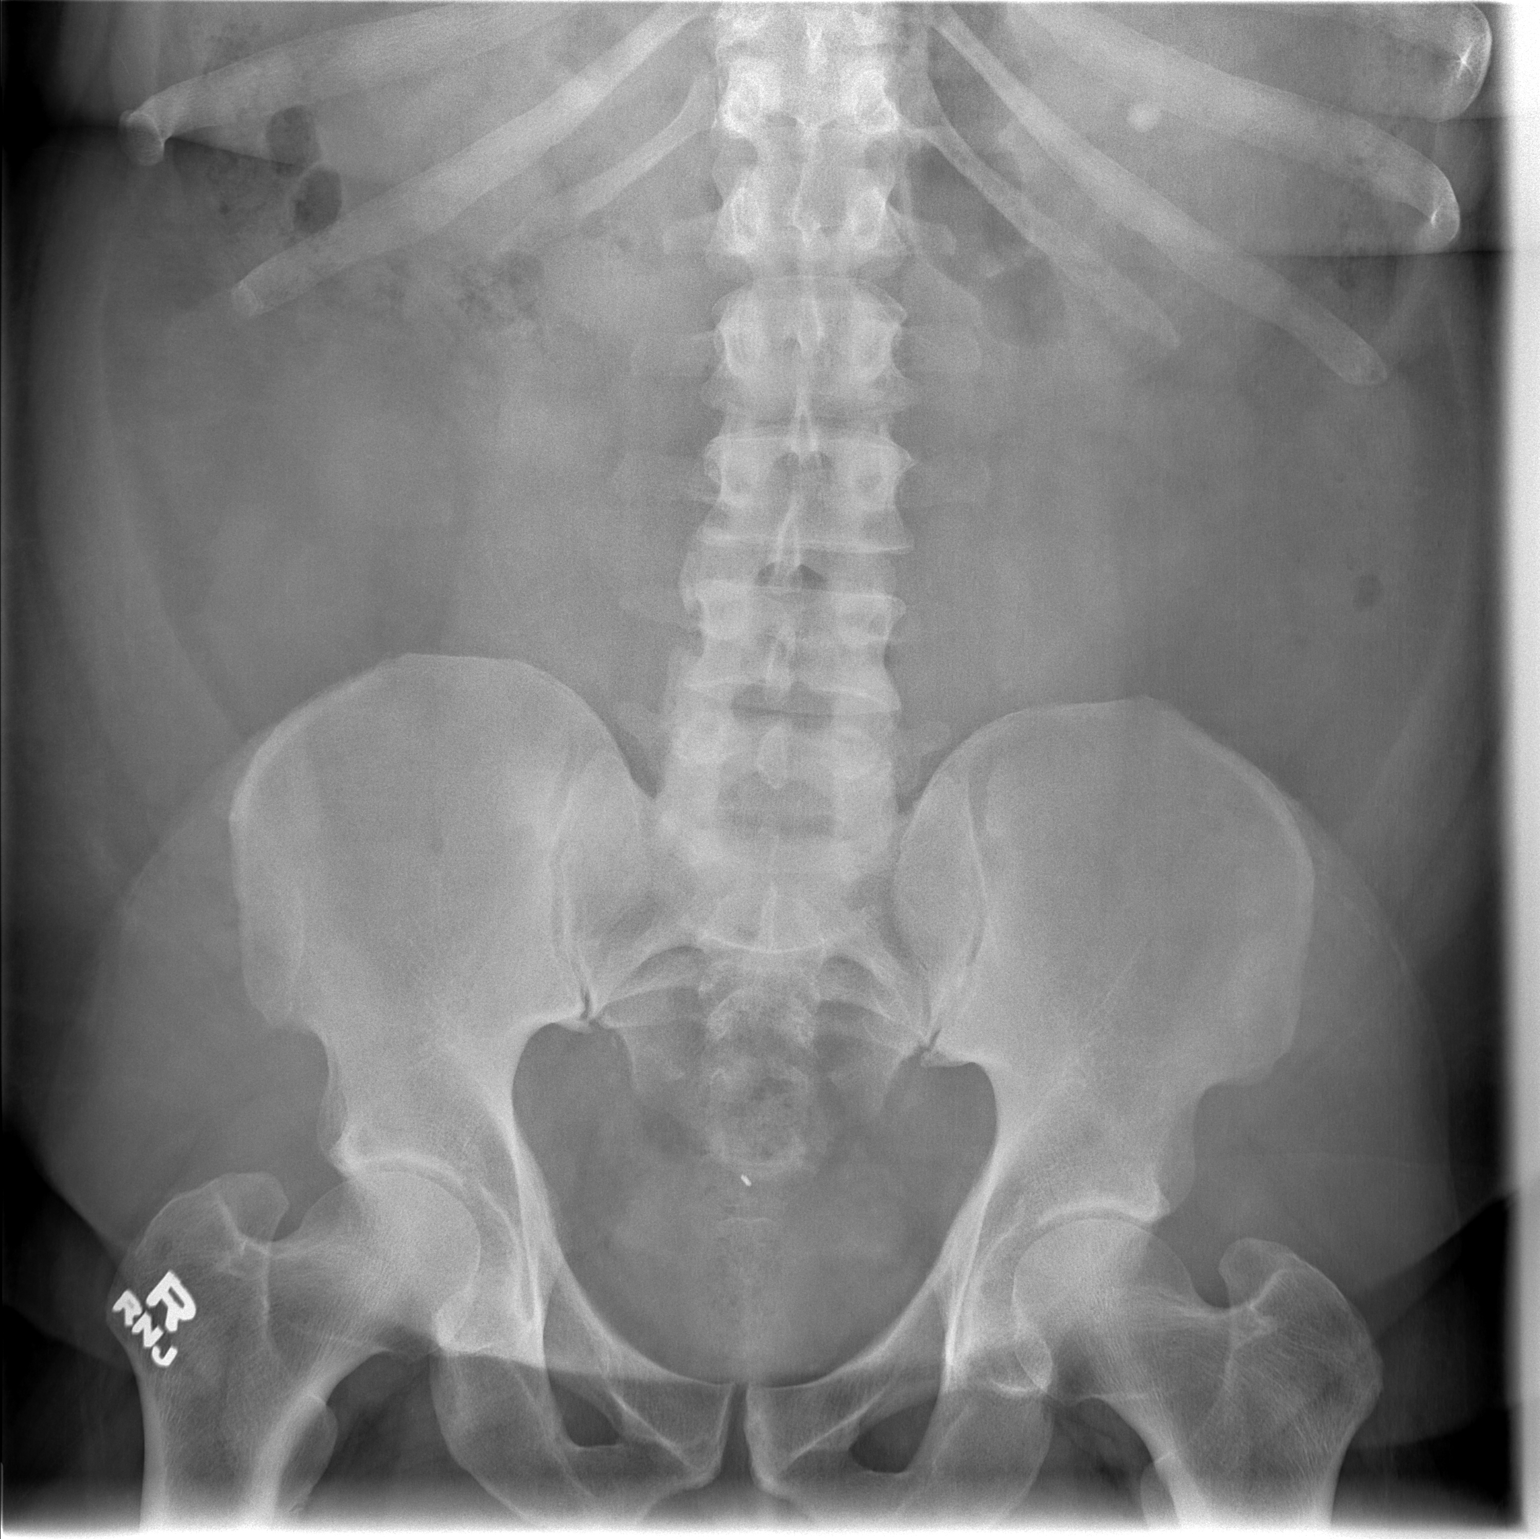

[t abdomen supine * (2 of 2)]
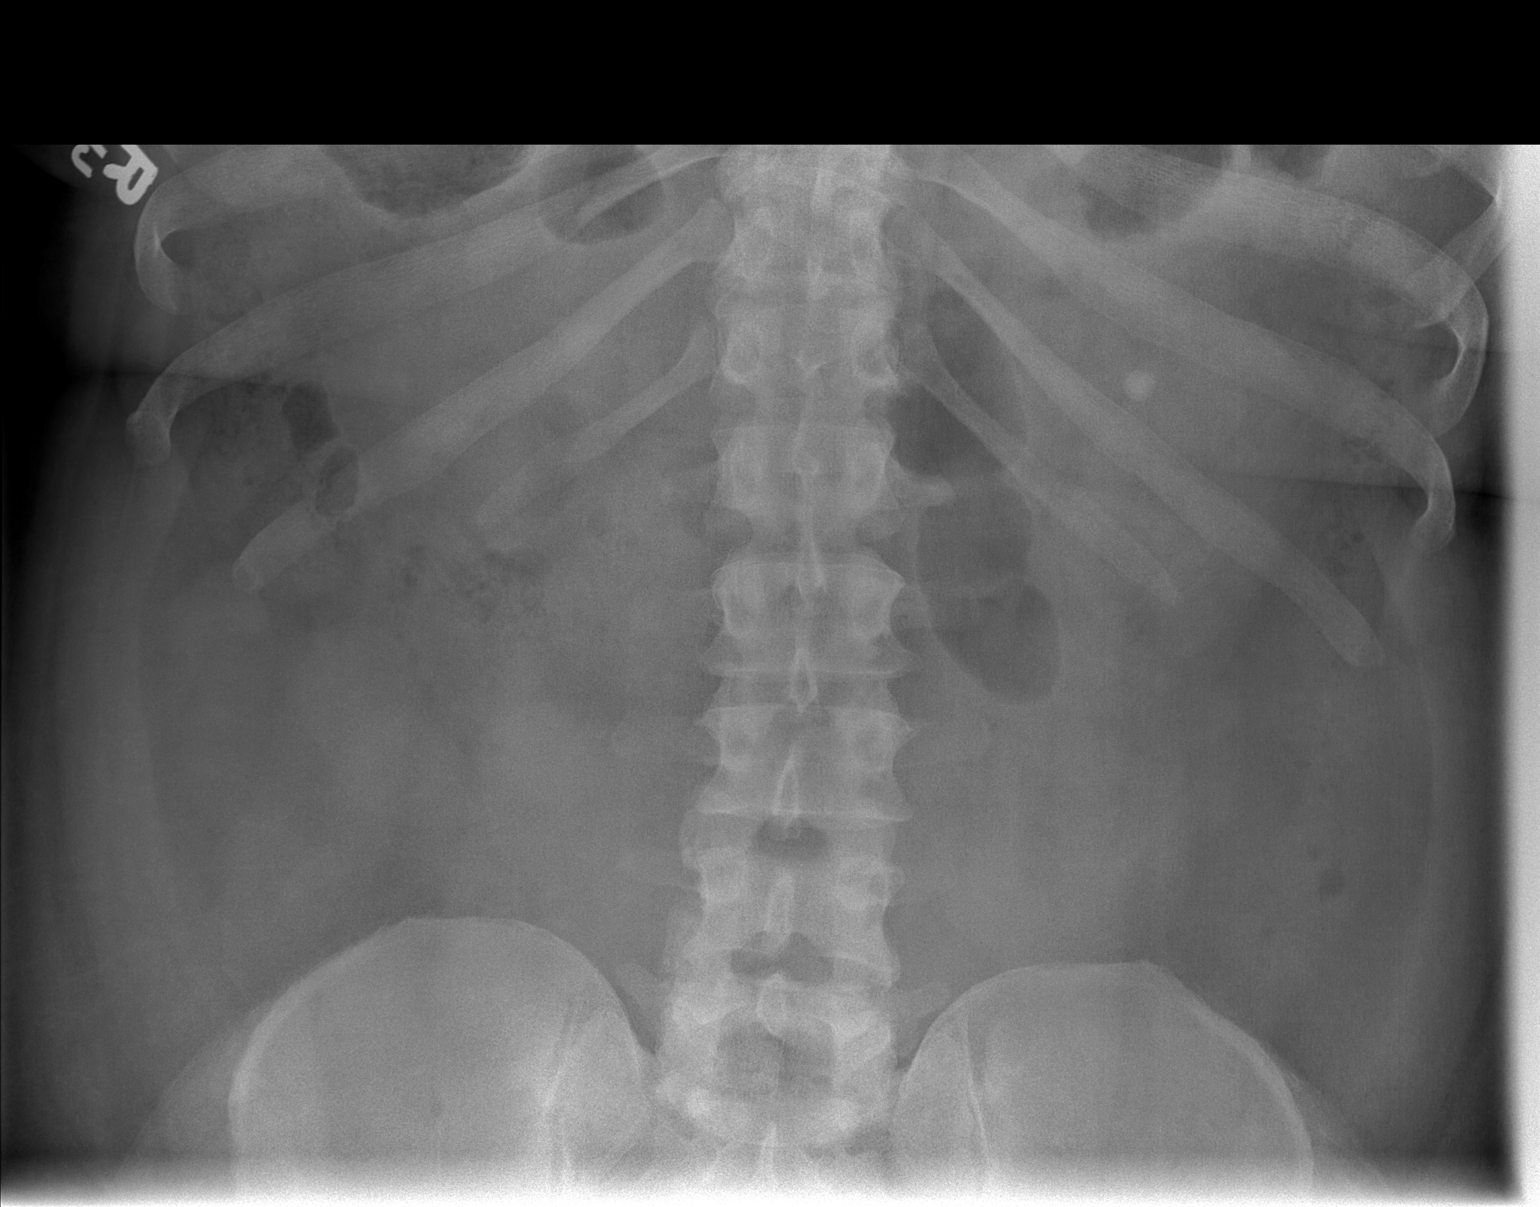

[2 of 2 positions shown; findings below may reference images not displayed]

FINDINGS: 9 mm calcification projects over the mid upper arm left renal bed,
unchanged from prior exam. No definite additional calculi ovary
either renal bed, course of the ureter or in the pelvis. Surgical
clips in the right upper quadrant of the abdomen, likely from
cholecystectomy. Probable surgical clips in the pelvis. Normal bowel
gas pattern without bowel dilatation. No acute osseous abnormality.
IMPRESSION: 9 mm calculus projects over the mid upper left kidney.

## 2018-03-10 IMAGING — DX DG ABDOMEN 1V
2 series · 2 of 2 positions shown · non-contrast
Comparison: 02/16/2016

CLINICAL DATA: Follow-up after lithotripsy 2 weeks ago for left
kidney stone.

EXAM:
ABDOMEN - 1 VIEW

[abdomen kub (1 of 2)]
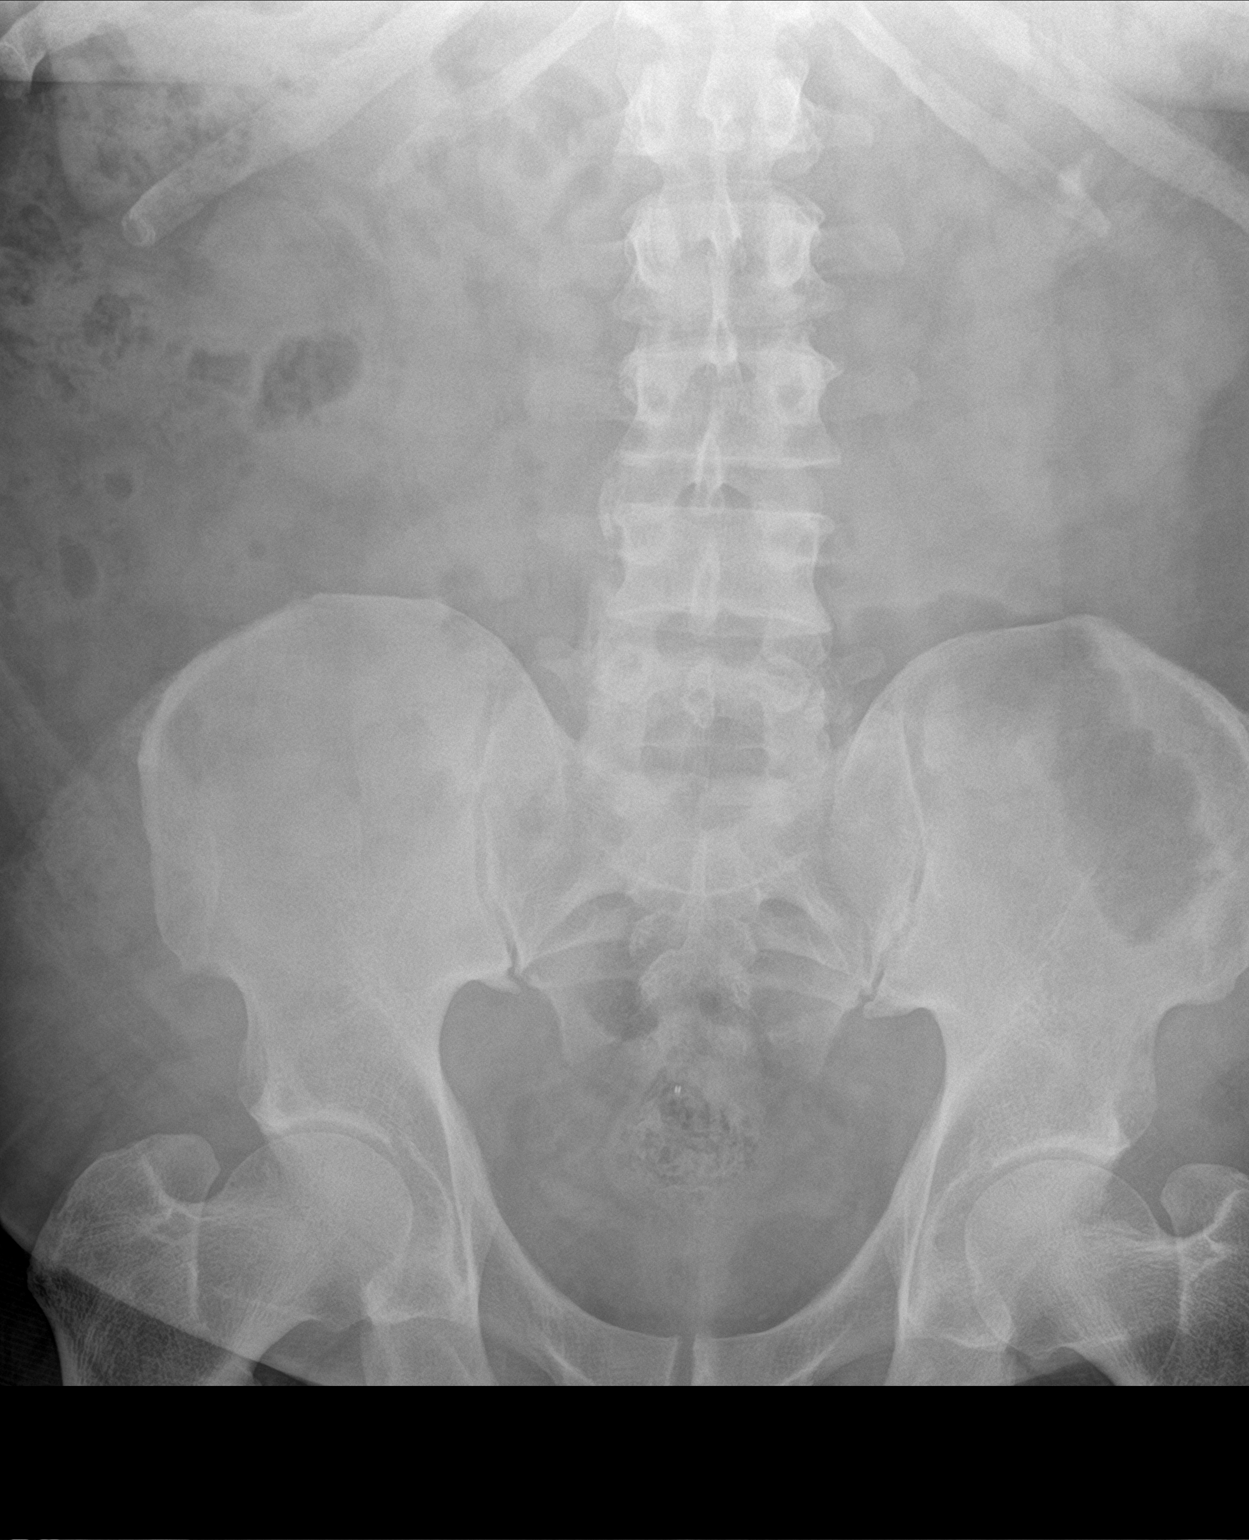

[abdomen kub (2 of 2)]
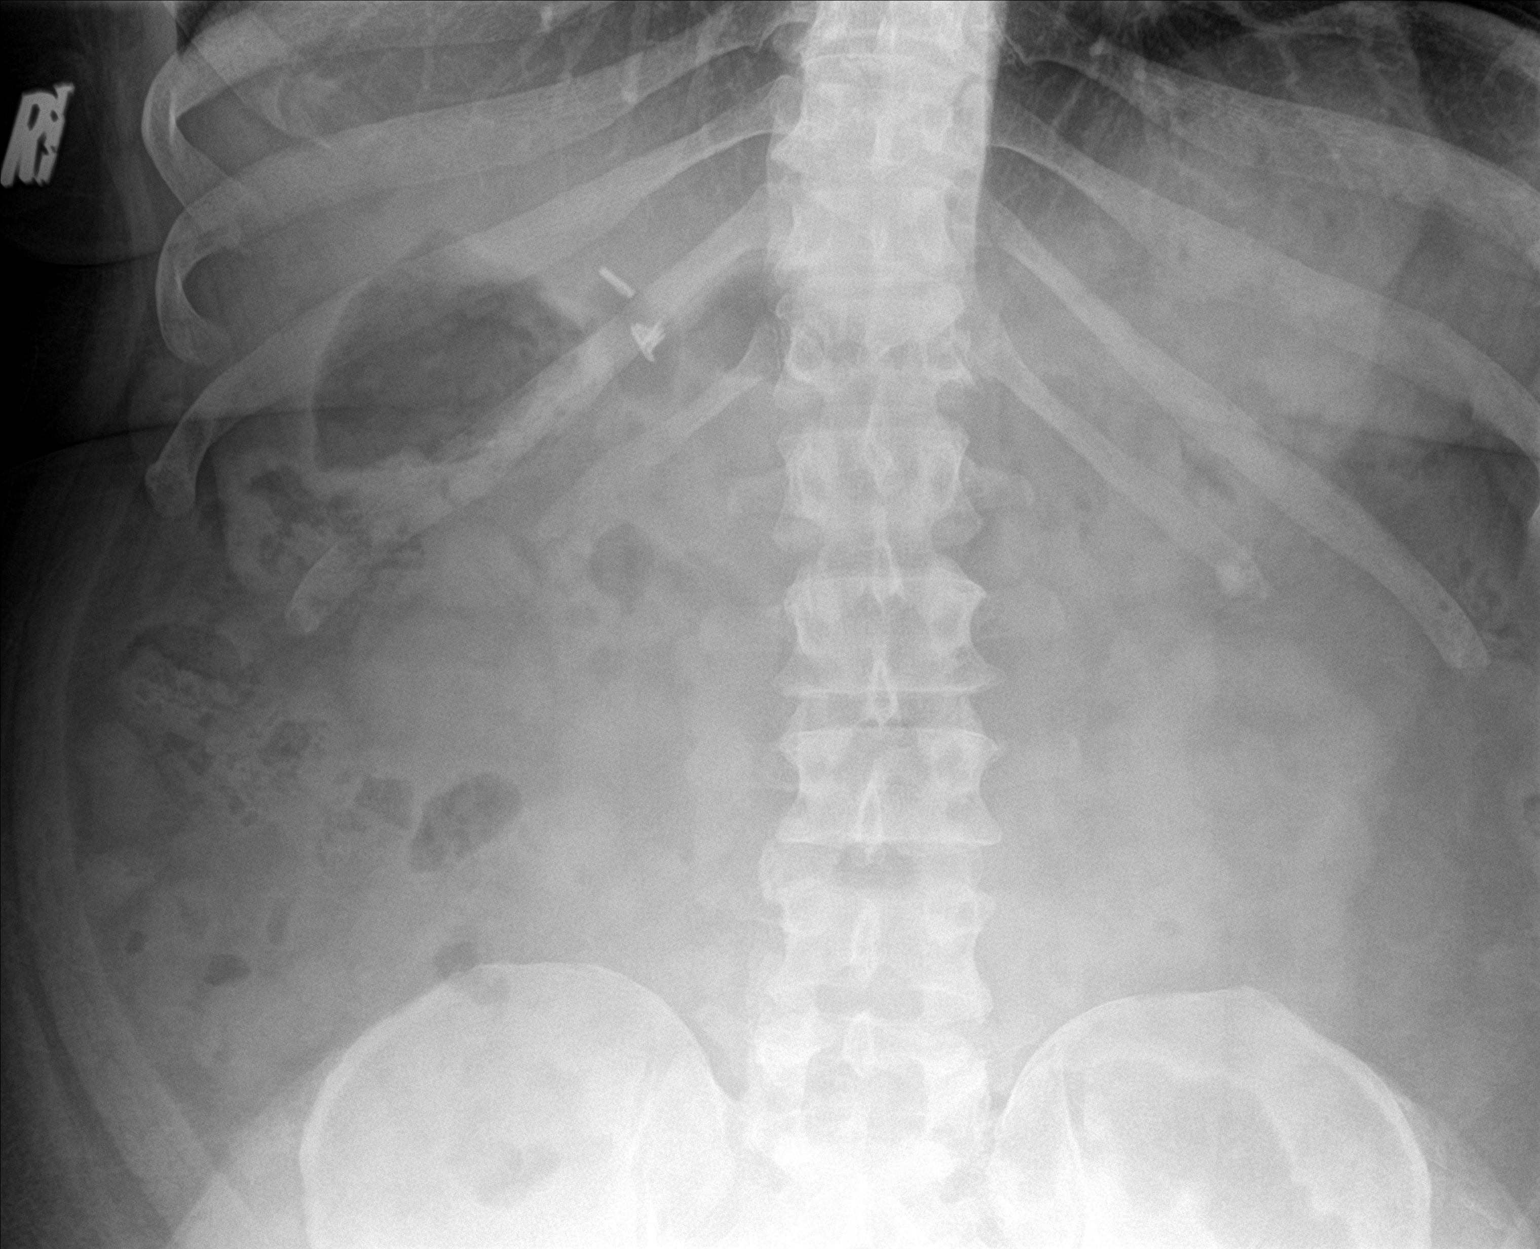

[2 of 2 positions shown; findings below may reference images not displayed]

FINDINGS: Left renal stone again noted measuring approximately 9 mm. Probable
small adjacent fragments. No additional suspicious calcification.
Prior cholecystectomy. No obstruction or free air.
IMPRESSION: Left mid upper pole nephrolithiasis again noted.

## 2018-09-05 ENCOUNTER — Ambulatory Visit: Payer: 59 | Admitting: Family Medicine

## 2020-03-20 ENCOUNTER — Ambulatory Visit: Payer: Self-pay | Attending: Internal Medicine

## 2020-03-20 DIAGNOSIS — Z23 Encounter for immunization: Secondary | ICD-10-CM

## 2020-03-20 NOTE — Progress Notes (Signed)
   Covid-19 Vaccination Clinic  Name:  DRAGAN TAMBURRINO    MRN: [id number] DOB: [birth year]  03/20/2020  Mr. Cork was observed post Covid-19 immunization for 15 minutes without incident. He was provided with Vaccine Information Sheet and instruction to access the V-Safe system.   Mr. Noy was instructed to call 911 with any severe reactions post vaccine: Marland Kitchen Difficulty breathing  . Swelling of face and throat  . A fast heartbeat  . A bad rash all over body  . Dizziness and weakness

## 2020-08-06 ENCOUNTER — Encounter: Payer: Self-pay | Admitting: Nurse Practitioner

## 2020-08-06 ENCOUNTER — Other Ambulatory Visit: Payer: Self-pay

## 2020-08-06 ENCOUNTER — Ambulatory Visit (INDEPENDENT_AMBULATORY_CARE_PROVIDER_SITE_OTHER): Payer: 59 | Admitting: Nurse Practitioner

## 2020-08-06 VITALS — BP 178/100 | HR 86 | Temp 99.2°F | Ht 66.0 in | Wt 284.0 lb

## 2020-08-06 DIAGNOSIS — R7303 Prediabetes: Secondary | ICD-10-CM | POA: Diagnosis not present

## 2020-08-06 DIAGNOSIS — I10 Essential (primary) hypertension: Secondary | ICD-10-CM

## 2020-08-06 DIAGNOSIS — Z139 Encounter for screening, unspecified: Secondary | ICD-10-CM | POA: Insufficient documentation

## 2020-08-06 DIAGNOSIS — Z6841 Body Mass Index (BMI) 40.0 and over, adult: Secondary | ICD-10-CM

## 2020-08-06 DIAGNOSIS — Z0000 Encounter for general adult medical examination without abnormal findings: Secondary | ICD-10-CM | POA: Insufficient documentation

## 2020-08-06 DIAGNOSIS — Z23 Encounter for immunization: Secondary | ICD-10-CM

## 2020-08-06 DIAGNOSIS — Z7689 Persons encountering health services in other specified circumstances: Secondary | ICD-10-CM | POA: Diagnosis not present

## 2020-08-06 MED ORDER — AMLODIPINE-OLMESARTAN 5-20 MG PO TABS: 1.0000 | ORAL_TABLET | Freq: Every day | ORAL | 1 refills | Status: DC

## 2020-08-06 NOTE — Assessment & Plan Note (Signed)
-  will check HCV and HIV with next set of labs -referral for screening colonoscopy

## 2020-08-06 NOTE — Assessment & Plan Note (Signed)
-  will check A1c with next set of labs

## 2020-08-06 NOTE — Assessment & Plan Note (Signed)
BP Readings from Last 3 Encounters:  08/06/20 (!) 178/100  12/07/16 138/88  11/19/16 (!) 138/96   -he hasn't had his BP meds in a long time -Rx. Amlodipine-olmesartan

## 2020-08-06 NOTE — Progress Notes (Signed)
New Patient Office Visit  Subjective:  Patient ID: Christopher Thomas, male    DOB: [birth year]  Age: 55 y.o. MRN: [id number]  CC:  Chief Complaint  Patient presents with  . New Patient (Initial Visit)    Here to establish care. Has concerns about recent hypertension noted at a dental procedure.    HPI Christopher Thomas presents for new patient visit. He was an [facility] patient 3-4 years ago. [facility] [place] Last physical and labs were drawn in 2018.  He states he was getting his BP checked with a dental procedure, and his DBP was > 120.  Past Medical History:  Diagnosis Date  . History of kidney stones   . Hypertension   . Kidney stones     Past Surgical History:  Procedure Laterality Date  . APPENDECTOMY    . CHOLECYSTECTOMY    . lithrotripsy      Family History  Problem Relation Age of Onset  . COPD Mother   . Hypertension Mother   . Alcohol abuse Mother   . Asthma Mother   . Heart disease Mother        died at 59  . Hypertension Father   . Stroke Sister   . Alcohol abuse Sister   . Cancer Brother   . Early death Brother        homicide    Social History   Socioeconomic History  . Marital status: Significant Other    Spouse name: Pam  . Number of children: 1  . Years of education: 22  . Highest education level: Not on file  Occupational History  . Occupation: Engineer, building services    Comment: [employer]  Tobacco Use  . Smoking status: Never Smoker  . Smokeless tobacco: Never Used  Vaping Use  . Vaping Use: Never used  Substance and Sexual Activity  . Alcohol use: No    Comment: occasionally- maybe one drink per week  . Drug use: No  . Sexual activity: Yes    Birth control/protection: None  Other Topics Concern  . 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   Loves outdoors, fishing and hunting   Social Determinants of Health    Financial Resource Strain: Not on file  Food Insecurity: Not on file  Transportation Needs: Not on file  Physical Activity: Not on file  Stress: Not on file  Social Connections: Not on file  Intimate Partner Violence: Not on file    ROS Review of Systems  Objective:   Today's Vitals: BP (!) 178/100 (BP Location: Right Arm, Patient Position: Sitting, Cuff Size: Normal)   Pulse 86   Temp 99.2 F (37.3 C) (Oral)   Ht '5\' 6"'  (1.676 m)   Wt 284 lb (128.8 kg)   SpO2 93%   BMI 45.84 kg/m   Physical Exam  Assessment & Plan:   Problem List Items Addressed This Visit      Cardiovascular and Mediastinum   Essential hypertension    BP Readings from Last 3 Encounters:  08/06/20 (!) 178/100  12/07/16 138/88  11/19/16 (!) 138/96   -he hasn't had his BP meds in a long time -Rx. Amlodipine-olmesartan      Relevant Medications   amLODipine-olmesartan (AZOR) 5-20 MG tablet   Other Relevant Orders   CBC with Differential/Platelet   CMP14+EGFR   Lipid Panel With  LDL/HDL Ratio     Other   OBESITY    -BMI 45 -he states he has gained about 40 pounds in the last 3 years -will check thyroid with next set of labs      Relevant Orders   TSH + free T4   Prediabetes    -will check A1c with next set of labs      Relevant Orders   Hemoglobin A1c   Lipid Panel With LDL/HDL Ratio   Microalbumin / creatinine urine ratio   Encounter to establish care - Primary    -get records form [facility]      Relevant Orders   CBC with Differential/Platelet   CMP14+EGFR   HCV Ab w/Rflx to Verification   Hemoglobin A1c   HIV Antibody (routine testing w rflx)   Lipid Panel With LDL/HDL Ratio   Microalbumin / creatinine urine ratio   TSH + free T4   Screening due    -will check HCV and HIV with next set of labs -referral for screening colonoscopy      Relevant Orders   Ambulatory referral to Gastroenterology   HCV Ab w/Rflx to Verification   HIV Antibody (routine testing w rflx)    Immunization due    -TDaP today         Outpatient Encounter Medications as of 08/06/2020  Medication Sig  . amLODipine-olmesartan (AZOR) 5-20 MG tablet Take 1 tablet by mouth daily.  . [DISCONTINUED] amLODipine (NORVASC) 5 MG tablet Take 1 tablet (5 mg total) by mouth daily. (Patient not taking: Reported on 08/06/2020)  . [DISCONTINUED] losartan-hydrochlorothiazide (HYZAAR) 50-12.5 MG tablet Take 1 tablet by mouth daily. (Patient not taking: Reported on 08/06/2020)   No facility-administered encounter medications on file as of 08/06/2020.    Follow-up: Return in about 1 month (around 09/06/2020) for Physical Exam.   Noreene Larsson, NP

## 2020-08-06 NOTE — Assessment & Plan Note (Addendum)
-  BMI 45 -he states he has gained about 40 pounds in the last 3 years -will check thyroid with next set of labs

## 2020-08-06 NOTE — Assessment & Plan Note (Signed)
-  [facility] form [facility]

## 2020-08-06 NOTE — Assessment & Plan Note (Signed)
TDaP today 

## 2020-08-11 ENCOUNTER — Encounter: Payer: Self-pay | Admitting: Internal Medicine

## 2020-09-05 ENCOUNTER — Other Ambulatory Visit: Payer: Self-pay

## 2020-09-05 ENCOUNTER — Ambulatory Visit (INDEPENDENT_AMBULATORY_CARE_PROVIDER_SITE_OTHER): Payer: 59 | Admitting: Nurse Practitioner

## 2020-09-05 ENCOUNTER — Encounter: Payer: Self-pay | Admitting: Nurse Practitioner

## 2020-09-05 DIAGNOSIS — E559 Vitamin D deficiency, unspecified: Secondary | ICD-10-CM | POA: Diagnosis not present

## 2020-09-05 DIAGNOSIS — R7303 Prediabetes: Secondary | ICD-10-CM | POA: Diagnosis not present

## 2020-09-05 DIAGNOSIS — I10 Essential (primary) hypertension: Secondary | ICD-10-CM

## 2020-09-05 MED ORDER — AMLODIPINE-OLMESARTAN 10-40 MG PO TABS: 1.0000 | ORAL_TABLET | Freq: Every day | ORAL | 3 refills | Status: DC

## 2020-09-05 NOTE — Progress Notes (Signed)
Established Patient Office Visit  Subjective:  Patient ID: Christopher Thomas, male    DOB: 09-Nov-1965  Age: 55 y.o. MRN: [id number]  CC:  Chief Complaint  Patient presents with  . Annual Exam    HPI Christopher Thomas presents for physical exam. He did not have fasting labs drawn prior to this appointment.  He has HTN, and he has been taking amlodipine-olmesartan.  Denies adverse medication effects.  Past Medical History:  Diagnosis Date  . History of kidney stones   . Hypertension   . Kidney stones   . Kidney stones 11/05/2016    Past Surgical History:  Procedure Laterality Date  . APPENDECTOMY    . CHOLECYSTECTOMY    . lithrotripsy      Family History  Problem Relation Age of Onset  . COPD Mother   . Hypertension Mother   . Alcohol abuse Mother   . Asthma Mother   . Heart disease Mother        died at 97  . Hypertension Father   . Stroke Sister   . Alcohol abuse Sister   . Cancer Brother   . Early death Brother        homicide    Social History   Socioeconomic History  . Marital status: Significant Other    Spouse name: Pam  . Number of children: 1  . Years of education: 18  . Highest education level: Not on file  Occupational History  . Occupation: Engineer, building services    Comment: [employer]  Tobacco Use  . Smoking status: Never Smoker  . Smokeless tobacco: Never Used  Vaping Use  . Vaping Use: Never used  Substance and Sexual Activity  . Alcohol use: No    Comment: occasionally- maybe one drink per week  . Drug use: No  . Sexual activity: Yes    Birth control/protection: None  Other Topics Concern  . 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 amLODipine-olmesartan (AZOR) 5-20 MG tablet Take 1 tablet by mouth daily. 30 tablet 1   No facility-administered medications prior to visit.    Allergies  Allergen Reactions  . Lisinopril Cough    ROS Review of Systems  Constitutional: Negative.   HENT: Negative.   Eyes: Negative.   Respiratory: Negative.   Cardiovascular: Negative.   Gastrointestinal: Negative.   Endocrine: Negative.   Genitourinary: Negative.   Musculoskeletal: Negative.   Skin: Negative.   Allergic/Immunologic: Negative.   Neurological: Negative.   Hematological: Negative.   Psychiatric/Behavioral: Negative.       Objective:    Physical Exam Constitutional:      Appearance: Normal appearance. He is obese.  HENT:     Head: Normocephalic and atraumatic.     Right Ear: Tympanic membrane, ear canal and external ear normal.     Left Ear: Tympanic membrane, ear canal and external ear normal.     Nose: Nose normal.     Mouth/Throat:  Mouth: Mucous membranes are moist.     Pharynx: Oropharynx is clear.  Eyes:     Extraocular Movements: Extraocular movements intact.     Conjunctiva/sclera: Conjunctivae normal.     Pupils: Pupils are equal, round, and reactive to light.  Cardiovascular:     Rate and Rhythm: Normal rate and regular rhythm.     Pulses: Normal pulses.     Heart sounds: Normal heart sounds.  Pulmonary:     Effort: Pulmonary effort is normal.     Breath sounds: Normal breath sounds.  Abdominal:     General: Abdomen is flat. Bowel sounds are normal.     Palpations: Abdomen is soft.  Musculoskeletal:        General: Normal range of motion.     Cervical back: Normal range of motion and neck supple.  Skin:    General: Skin is warm and dry.     Capillary Refill: Capillary refill takes less than 2 seconds.  Neurological:      General: No focal deficit present.     Mental Status: He is alert and oriented to person, place, and time.     Cranial Nerves: No cranial nerve deficit.     Sensory: No sensory deficit.     Motor: No weakness.     Coordination: Coordination normal.     Gait: Gait normal.  Psychiatric:        Mood and Affect: Mood normal.        Behavior: Behavior normal.        Thought Content: Thought content normal.        Judgment: Judgment normal.     BP (!) 168/90   Pulse 80   Temp 98.4 F (36.9 C)   Resp 20   Ht 5\' 7"  (1.702 m)   Wt 281 lb (127.5 kg)   SpO2 91%   BMI 44.01 kg/m  Wt Readings from Last 3 Encounters:  09/05/20 281 lb (127.5 kg)  08/06/20 284 lb (128.8 kg)  12/07/16 266 lb 4 oz (120.8 kg)     Health Maintenance Due  Topic Date Due  . Hepatitis C Screening  Never done  . HIV Screening  Never done  . TETANUS/TDAP  Never done  . COLONOSCOPY (Pts 45-66yrs Insurance coverage will need to be confirmed)  Never done    There are no preventive care reminders to display for this patient.  No results found for: TSH Lab Results  Component Value Date   WBC 8.6 11/05/2016   HGB 15.2 11/05/2016   HCT 47.1 11/05/2016   MCV 89.2 11/05/2016   PLT 209 11/05/2016   Lab Results  Component Value Date   NA 138 11/05/2016   K 4.3 11/05/2016   CO2 29 11/05/2016   GLUCOSE 85 11/05/2016   BUN 15 11/05/2016   CREATININE 1.11 11/05/2016   BILITOT 0.4 11/05/2016   ALKPHOS 77 11/05/2016   AST 17 11/05/2016   ALT 26 11/05/2016   PROT 7.1 11/05/2016   ALBUMIN 4.3 11/05/2016   CALCIUM 9.1 11/05/2016   Lab Results  Component Value Date   CHOL 133 11/05/2016   Lab Results  Component Value Date   HDL 54 11/05/2016   Lab Results  Component Value Date   LDLCALC 69 11/05/2016   Lab Results  Component Value Date   TRIG 50 11/05/2016   Lab Results  Component Value Date   CHOLHDL 2.5 11/05/2016   Lab Results  Component Value Date   HGBA1C 5.7 (  H) 11/05/2016       Assessment & Plan:   Problem List Items Addressed This Visit      Cardiovascular and Mediastinum   Essential hypertension    BP Readings from Last 3 Encounters:  09/05/20 (!) 168/90  08/06/20 (!) 178/100  12/07/16 138/88  -INCREASE amlodipine-olmesartan to 10-40 mg      Relevant Medications   amLODipine-olmesartan (AZOR) 10-40 MG tablet     Other   Prediabetes    -labs ordered      Vitamin D deficiency    -labs ordered         Meds ordered this encounter  Medications  . amLODipine-olmesartan (AZOR) 10-40 MG tablet    Sig: Take 1 tablet by mouth daily.    Dispense:  90 tablet    Refill:  3    Follow-up: Return in about 6 months (around 03/07/2021) for BP check (same day fasting labs).    Noreene Larsson, NP

## 2020-09-05 NOTE — Assessment & Plan Note (Signed)
labs ordered

## 2020-09-05 NOTE — Patient Instructions (Signed)
Please have fasting labs drawn this week.  For your appointment in 6 months, please come to the appointment fasting, and we may need to draw labs then.

## 2020-09-05 NOTE — Assessment & Plan Note (Signed)
BP Readings from Last 3 Encounters:  09/05/20 (!) 168/90  08/06/20 (!) 178/100  12/07/16 138/88  -INCREASE amlodipine-olmesartan to 10-40 mg

## 2020-09-10 LAB — CBC WITH DIFFERENTIAL/PLATELET
Immature Grans (Abs): 0 10*3/uL (ref 0.0–0.1)
MCHC: 31.9 g/dL (ref 31.5–35.7)
MCV: 88 fL (ref 79–97)
Monocytes Absolute: 0.6 10*3/uL (ref 0.1–0.9)
Monocytes: 8 %
Neutrophils: 47 %
RBC: 5.19 x10E6/uL (ref 4.14–5.80)

## 2020-09-10 LAB — CMP14+EGFR
Bilirubin Total: 0.2 mg/dL (ref 0.0–1.2)
Globulin, Total: 2.3 g/dL (ref 1.5–4.5)
eGFR: 87 mL/min/{1.73_m2} (ref >59)

## 2020-09-10 LAB — LIPID PANEL WITH LDL/HDL RATIO
Cholesterol, Total: 130 mg/dL (ref 100–199)
HDL: 47 mg/dL (ref >39)
LDL Chol Calc (NIH): 71 mg/dL (ref 0–99)
LDL/HDL Ratio: 1.5 ratio (ref 0.0–3.6)
Triglycerides: 54 mg/dL (ref 0–149)

## 2020-09-10 NOTE — Progress Notes (Signed)
Labs look great. A1c is 6.0, so it is at goal. Thyroid function is great as well.

## 2020-09-11 LAB — CBC WITH DIFFERENTIAL/PLATELET
Basophils Absolute: 0.1 10*3/uL (ref 0.0–0.2)
Basos: 1 %
EOS (ABSOLUTE): 0.2 10*3/uL (ref 0.0–0.4)
Eos: 3 %
Hematocrit: 45.7 % (ref 37.5–51.0)
Hemoglobin: 14.6 g/dL (ref 13.0–17.7)
Immature Granulocytes: 0 %
Lymphocytes Absolute: 3.2 10*3/uL — ABNORMAL HIGH (ref 0.7–3.1)
Lymphs: 41 %
MCH: 28.1 pg (ref 26.6–33.0)
Neutrophils Absolute: 3.8 10*3/uL (ref 1.4–7.0)
Platelets: 215 10*3/uL (ref 150–450)
RDW: 13.7 % (ref 11.6–15.4)
WBC: 8 10*3/uL (ref 3.4–10.8)

## 2020-09-11 LAB — CMP14+EGFR
ALT: 24 IU/L (ref 0–44)
AST: 20 IU/L (ref 0–40)
Albumin/Globulin Ratio: 1.8 (ref 1.2–2.2)
Albumin: 4.2 g/dL (ref 3.8–4.9)
Alkaline Phosphatase: 81 IU/L (ref 44–121)
BUN/Creatinine Ratio: 19 (ref 9–20)
BUN: 19 mg/dL (ref 6–24)
CO2: 26 mmol/L (ref 20–29)
Calcium: 9.1 mg/dL (ref 8.7–10.2)
Chloride: 102 mmol/L (ref 96–106)
Creatinine, Ser: 1.01 mg/dL (ref 0.76–1.27)
Glucose: 89 mg/dL (ref 65–99)
Potassium: 4.5 mmol/L (ref 3.5–5.2)
Sodium: 140 mmol/L (ref 134–144)
Total Protein: 6.5 g/dL (ref 6.0–8.5)

## 2020-09-11 LAB — TSH+FREE T4
Free T4: 1.22 ng/dL (ref 0.82–1.77)
TSH: 0.931 u[IU]/mL (ref 0.450–4.500)

## 2020-09-11 LAB — LIPID PANEL WITH LDL/HDL RATIO: VLDL Cholesterol Cal: 12 mg/dL (ref 5–40)

## 2020-09-11 LAB — HEMOGLOBIN A1C
Est. average glucose Bld gHb Est-mCnc: 126 mg/dL
Hgb A1c MFr Bld: 6 % — ABNORMAL HIGH (ref 4.8–5.6)

## 2020-09-11 LAB — MICROALBUMIN / CREATININE URINE RATIO
Creatinine, Urine: 99.9 mg/dL
Microalb/Creat Ratio: 21 mg/g creat (ref 0–29)
Microalbumin, Urine: 21.3 ug/mL

## 2020-09-11 LAB — HIV ANTIBODY (ROUTINE TESTING W REFLEX): HIV Screen 4th Generation wRfx: NONREACTIVE

## 2020-09-12 LAB — HCV AB W REFLEX TO QUANT PCR: HCV Ab: <0.1 s/co ratio (ref 0.0–0.9)

## 2020-09-12 LAB — HCV INTERPRETATION

## 2020-09-12 LAB — SPECIMEN STATUS REPORT

## 2020-09-16 ENCOUNTER — Ambulatory Visit (INDEPENDENT_AMBULATORY_CARE_PROVIDER_SITE_OTHER): Payer: 59 | Admitting: Gastroenterology

## 2020-09-16 ENCOUNTER — Telehealth: Payer: Self-pay

## 2020-09-16 ENCOUNTER — Other Ambulatory Visit: Payer: Self-pay

## 2020-09-16 ENCOUNTER — Encounter: Payer: Self-pay | Admitting: Gastroenterology

## 2020-09-16 DIAGNOSIS — Z1211 Encounter for screening for malignant neoplasm of colon: Secondary | ICD-10-CM | POA: Diagnosis not present

## 2020-09-16 MED ORDER — PEG 3350-KCL-NA BICARB-NACL 420 G PO SOLR: 4000.0000 mL | ORAL | 0 refills | Status: DC

## 2020-09-16 NOTE — Progress Notes (Signed)
Primary Care Physician:  Noreene Larsson, NP  Primary Gastroenterologist:  Elon Alas. Abbey Chatters, DO   Chief Complaint  Patient presents with  . Consult    Never had TCS done prior. PGM hx colon cancer    HPI:  Christopher Thomas is a 55 y.o. male here at the request of Demetrius Revel, NP for screening colonoscopy.  Patient has never had a colonoscopy.  Paternal grandmother had colon cancer in her 87s.  He denies any bowel complaints. BM regular, 2-3 times per day. No melena, brbpr. No abdominal pain. No heartburn. No dysphagia. No N/V.  Current Outpatient Medications  Medication Sig Dispense Refill  . amLODipine-olmesartan (AZOR) 10-40 MG tablet Take 1 tablet by mouth daily. 90 tablet 3   No current facility-administered medications for this visit.    Allergies as of 09/16/2020 - Review Complete 09/16/2020  Allergen Reaction Noted  . Lisinopril Cough 11/05/2016    Past Medical History:  Diagnosis Date  . History of kidney stones   . Hypertension   . Kidney stones   . Kidney stones 11/05/2016    Past Surgical History:  Procedure Laterality Date  . APPENDECTOMY    . CHOLECYSTECTOMY    . lithrotripsy      Family History  Problem Relation Age of Onset  . COPD Mother   . Hypertension Mother   . Alcohol abuse Mother   . Asthma Mother   . Heart disease Mother        died at 35  . Hypertension Father   . Stroke Sister   . Alcohol abuse Sister   . Cancer Brother   . Early death Brother        homicide  . Colon cancer Paternal Grandmother        in her 55s    Social History   Socioeconomic History  . Marital status: Significant Other    Spouse name: Pam  . Number of children: 1  . Years of education: 64  . Highest education level: Not on file  Occupational History  . Occupation: Engineer, building services    Comment: [employer]  Tobacco Use  . Smoking status: Never Smoker  . Smokeless tobacco: Never Used  Vaping Use  . Vaping Use: Never used  Substance and Sexual  Activity  . Alcohol use: Yes    Comment: occasionally- maybe one drink per week  . Drug use: No  . Sexual activity: Yes    Birth control/protection: None  Other Topics Concern  . 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 Eyes: Negative for vision changes.  ENT: Negative for hoarseness, difficulty swallowing , nasal congestion. CV: Negative for chest pain, angina, palpitations, dyspnea on exertion, peripheral edema.  Respiratory: Negative for dyspnea at rest, dyspnea on exertion, cough, sputum, wheezing.  GI: See history of present illness. GU:  Negative for dysuria, hematuria, urinary incontinence, urinary frequency, nocturnal urination.  MS: Negative for joint pain, low back pain.  Derm:  Negative for rash or itching.  Neuro: Negative for weakness, abnormal sensation, seizure, frequent headaches, memory loss, confusion.  Psych: Negative for anxiety, depression, suicidal ideation, hallucinations.  Endo: Negative for unusual weight change.  Heme: Negative for bruising or bleeding. Allergy: Negative for rash or hives.    Physical Examination:  BP (!) 161/98   Pulse 88   Temp (!) 97.3 F (36.3 C)   Ht 5\' 7"  (1.702 m)   Wt 284 lb 12.8 oz (129.2 kg)   BMI 44.61 kg/m    General: Well-nourished, well-developed in no acute distress.  Head: Normocephalic, atraumatic.   Eyes: Conjunctiva pink, no icterus. Mouth: masked Neck: Supple without thyromegaly, masses, or lymphadenopathy.  Lungs: Clear to  auscultation bilaterally.  Heart: Regular rate and rhythm, no murmurs rubs or gallops.  Abdomen: Bowel sounds are normal, nontender, nondistended, no hepatosplenomegaly or masses, no abdominal bruits or    hernia , no rebound or guarding.  Exam limited somewhat by body habitus. Rectal: not performed Extremities: No lower extremity edema. No clubbing or deformities.  Neuro: Alert and oriented x 4 , grossly normal neurologically.  Skin: Warm and dry, no rash or jaundice.   Psych: Alert and cooperative, normal mood and affect.  Labs: Lab Results  Component Value Date   CREATININE 1.01 09/09/2020   BUN 19 09/09/2020   NA 140 09/09/2020   K 4.5 09/09/2020   CL 102 09/09/2020   CO2 26 09/09/2020   Lab Results  Component Value Date   ALT 24 09/09/2020   AST 20 09/09/2020   ALKPHOS 81 09/09/2020   BILITOT 0.2 09/09/2020   Lab Results  Component Value Date   WBC 8.0 09/09/2020   HGB 14.6 09/09/2020   HCT 45.7 09/09/2020   MCV 88 09/09/2020   PLT 215 09/09/2020   Lab Results  Component Value Date   TSH 0.931 09/09/2020     Imaging Studies: No results found.   Assessment:  Pleasant 55 year old male presenting to schedule first-ever screening colonoscopy.  Given BMI greater than 40, he was brought in for further evaluation to assess risk of the procedure.  We will have anesthesiology assist with sedation given his morbid obesity. ASA III.    Plan:  Colonoscopy in the near future.  ASA III.  I have discussed the risks, alternatives, benefits with regards to but not limited to the risk of reaction to medication, bleeding, infection, perforation and the patient is agreeable to proceed. Written consent to be obtained.

## 2020-09-16 NOTE — Telephone Encounter (Signed)
PA for TCS submitted via [facility] website. PA# [id number], valid 10/28/20-01/26/21.

## 2020-09-16 NOTE — Patient Instructions (Addendum)
1. Colonoscopy as scheduled.  Please see separate instructions.Colonoscopy as scheduled. See separate instructions.

## 2020-09-16 NOTE — Progress Notes (Signed)
Cc'ed to pcp °

## 2020-10-22 NOTE — Patient Instructions (Signed)
Christopher Thomas  10/22/2020     @PREFPERIOPPHARMACY @   Your procedure is scheduled on  10/28/2020.   Report to [facility] at  [phone]  A.M.  Call this number if you have problems the morning of surgery:  [phone]   Remember:  Follow the diet and prep instructions given to you by the office.    Take these medicines the morning of surgery with A SIP OF WATER   amlodipine.     Please brush your teeth.  Do not wear jewelry, make-up or nail polish.  Do not wear lotions, powders, or perfumes, or deodorant.  Do not shave 48 hours prior to surgery.  Men may shave face and neck.  Do not bring valuables to the hospital.  [facility] is not responsible for any belongings or valuables.  Contacts, dentures or bridgework may not be worn into surgery.  Leave your suitcase in the car.  After surgery it may be brought to your room.  For patients admitted to the hospital, discharge time will be determined by your treatment team.  Patients discharged the day of surgery will not be allowed to drive home and must have someone with them for 24 hours.    Special instructions:  DO NOT smoke tobacco or vape for 24 hours before your procedure.  Please read over the following fact sheets that you were given. Anesthesia Post-op Instructions and Care and Recovery After Surgery      Colonoscopy, Adult, Care After This sheet gives you information about how to care for yourself after your procedure. Your health care provider may also give you more specific instructions. If you have problems or questions, contact your health careprovider. What can I expect after the procedure? After the procedure, it is common to have: A small amount of blood in your stool for 24 hours after the procedure. Some gas. Mild cramping or bloating of your abdomen. Follow these instructions at home: Eating and drinking  Drink enough fluid to keep your urine pale yellow. Follow instructions from your health  care provider about eating or drinking restrictions. Resume your normal diet as instructed by your health care provider. Avoid heavy or fried foods that are hard to digest.  Activity Rest as told by your health care provider. Avoid sitting for a long time without moving. Get up to take short walks every 1-2 hours. This is important to improve blood flow and breathing. Ask for help if you feel weak or unsteady. Return to your normal activities as told by your health care provider. Ask your health care provider what activities are safe for you. Managing cramping and bloating  Try walking around when you have cramps or feel bloated. Apply heat to your abdomen as told by your health care provider. Use the heat source that your health care provider recommends, such as a moist heat pack or a heating pad. Place a towel between your skin and the heat source. Leave the heat on for 20-30 minutes. Remove the heat if your skin turns bright red. This is especially important if you are unable to feel pain, heat, or cold. You may have a greater risk of getting burned.  General instructions If you were given a sedative during the procedure, it can affect you for several hours. Do not drive or operate machinery until your health care provider says that it is safe. For the first 24 hours after the procedure: Do not sign important documents. Do not  drink alcohol. Do your regular daily activities at a slower pace than normal. Eat soft foods that are easy to digest. Take over-the-counter and prescription medicines only as told by your health care provider. Keep all follow-up visits as told by your health care provider. This is important. Contact a health care provider if: You have blood in your stool 2-3 days after the procedure. Get help right away if you have: More than a small spotting of blood in your stool. Large blood clots in your stool. Swelling of your abdomen. Nausea or vomiting. A  fever. Increasing pain in your abdomen that is not relieved with medicine. Summary After the procedure, it is common to have a small amount of blood in your stool. You may also have mild cramping and bloating of your abdomen. If you were given a sedative during the procedure, it can affect you for several hours. Do not drive or operate machinery until your health care provider says that it is safe. Get help right away if you have a lot of blood in your stool, nausea or vomiting, a fever, or increased pain in your abdomen. This information is not intended to replace advice given to you by your health care provider. Make sure you discuss any questions you have with your healthcare provider. Document Revised: 04/20/2019 Document Reviewed: 11/20/2018 Elsevier Patient Education  [place] This sheet gives you information about how to care for yourself after your procedure. Your health care provider may also give you more specific instructions. If you have problems or questions, contact your health careprovider. What can I expect after the procedure? After the procedure, it is common to have: Tiredness. Forgetfulness about what happened after the procedure. Impaired judgment for important decisions. Nausea or vomiting. Some difficulty with balance. Follow these instructions at home: For the time period you were told by your health care provider:     Rest as needed. Do not participate in activities where you could fall or become injured. Do not drive or use machinery. Do not drink alcohol. Do not take sleeping pills or medicines that cause drowsiness. Do not make important decisions or sign legal documents. Do not take care of children on your own. Eating and drinking Follow the diet that is recommended by your health care provider. Drink enough fluid to keep your urine pale yellow. If you vomit: Drink water, juice, or soup when you can drink  without vomiting. Make sure you have little or no nausea before eating solid foods. General instructions Have a responsible adult stay with you for the time you are told. It is important to have someone help care for you until you are awake and alert. Take over-the-counter and prescription medicines only as told by your health care provider. If you have sleep apnea, surgery and certain medicines can increase your risk for breathing problems. Follow instructions from your health care provider about wearing your sleep device: Anytime you are sleeping, including during daytime naps. While taking prescription pain medicines, sleeping medicines, or medicines that make you drowsy. Avoid smoking. Keep all follow-up visits as told by your health care provider. This is important. Contact a health care provider if: You keep feeling nauseous or you keep vomiting. You feel light-headed. You are still sleepy or having trouble with balance after 24 hours. You develop a rash. You have a fever. You have redness or swelling around the IV site. Get help right away if: You have trouble breathing. You have  new-onset confusion at home. Summary For several hours after your procedure, you may feel tired. You may also be forgetful and have poor judgment. Have a responsible adult stay with you for the time you are told. It is important to have someone help care for you until you are awake and alert. Rest as told. Do not drive or operate machinery. Do not drink alcohol or take sleeping pills. Get help right away if you have trouble breathing, or if you suddenly become confused. This information is not intended to replace advice given to you by your health care provider. Make sure you discuss any questions you have with your healthcare provider. Document Revised: 01/10/2020 Document Reviewed: 03/29/2019 Elsevier Patient Education  2022 [employer].

## 2020-10-24 ENCOUNTER — Encounter (HOSPITAL_COMMUNITY)
Admission: RE | Admit: 2020-10-24 | Discharge: 2020-10-24 | Disposition: A | Discharge status: Home / Self Care | Payer: 59 | Source: Ambulatory Visit | Attending: Internal Medicine | Admitting: Internal Medicine

## 2020-10-24 ENCOUNTER — Other Ambulatory Visit: Payer: Self-pay

## 2020-10-24 ENCOUNTER — Other Ambulatory Visit (HOSPITAL_COMMUNITY): Payer: 59 | Attending: Internal Medicine

## 2020-10-24 DIAGNOSIS — Z01810 Encounter for preprocedural cardiovascular examination: Secondary | ICD-10-CM | POA: Insufficient documentation

## 2020-10-24 NOTE — Progress Notes (Signed)
   10/24/20 1452  OBSTRUCTIVE SLEEP APNEA  Have you ever been diagnosed with sleep apnea through a sleep study? No  Do you snore loudly (loud enough to be heard through closed doors)?  1  Do you often feel tired, fatigued, or sleepy during the daytime (such as falling asleep during driving or talking to someone)? 0  Has anyone observed you stop breathing during your sleep? 0  Do you have, or are you being treated for high blood pressure? 1  BMI more than 35 kg/m2? 1  Age > 50 (1-yes) 1  Neck circumference greater than:Male 16 inches or larger, Male 17inches or larger? 1  Male Gender (Yes=1) 1  Obstructive Sleep Apnea Score 6  Score 5 or greater  Results sent to PCP

## 2020-10-28 ENCOUNTER — Ambulatory Visit (HOSPITAL_COMMUNITY): Payer: 59 | Admitting: Anesthesiology

## 2020-10-28 ENCOUNTER — Encounter (HOSPITAL_COMMUNITY): Payer: Self-pay

## 2020-10-28 ENCOUNTER — Other Ambulatory Visit: Payer: Self-pay

## 2020-10-28 ENCOUNTER — Ambulatory Visit (HOSPITAL_COMMUNITY)
Admission: RE | Admit: 2020-10-28 | Discharge: 2020-10-28 | Disposition: A | Discharge status: Home / Self Care | Payer: 59 | Attending: Internal Medicine | Admitting: Internal Medicine

## 2020-10-28 ENCOUNTER — Encounter (HOSPITAL_COMMUNITY)
Admission: RE | Disposition: A | Discharge status: Home / Self Care | Payer: Self-pay | Source: Home / Self Care | Attending: Internal Medicine

## 2020-10-28 DIAGNOSIS — Z9049 Acquired absence of other specified parts of digestive tract: Secondary | ICD-10-CM | POA: Insufficient documentation

## 2020-10-28 DIAGNOSIS — K5730 Diverticulosis of large intestine without perforation or abscess without bleeding: Secondary | ICD-10-CM | POA: Insufficient documentation

## 2020-10-28 DIAGNOSIS — K635 Polyp of colon: Secondary | ICD-10-CM | POA: Diagnosis not present

## 2020-10-28 DIAGNOSIS — K648 Other hemorrhoids: Secondary | ICD-10-CM | POA: Insufficient documentation

## 2020-10-28 DIAGNOSIS — D123 Benign neoplasm of transverse colon: Secondary | ICD-10-CM | POA: Diagnosis not present

## 2020-10-28 DIAGNOSIS — Z79899 Other long term (current) drug therapy: Secondary | ICD-10-CM | POA: Diagnosis not present

## 2020-10-28 DIAGNOSIS — D120 Benign neoplasm of cecum: Secondary | ICD-10-CM | POA: Insufficient documentation

## 2020-10-28 DIAGNOSIS — Z888 Allergy status to other drugs, medicaments and biological substances status: Secondary | ICD-10-CM | POA: Diagnosis not present

## 2020-10-28 DIAGNOSIS — Z800 Family history of malignant neoplasm of digestive organs: Secondary | ICD-10-CM | POA: Diagnosis not present

## 2020-10-28 DIAGNOSIS — Z1211 Encounter for screening for malignant neoplasm of colon: Secondary | ICD-10-CM | POA: Insufficient documentation

## 2020-10-28 HISTORY — PX: COLONOSCOPY WITH PROPOFOL: SHX5780

## 2020-10-28 HISTORY — PX: POLYPECTOMY: SHX149

## 2020-10-28 SURGERY — COLONOSCOPY WITH PROPOFOL
Anesthesia: General

## 2020-10-28 MED ORDER — LACTATED RINGERS IV SOLN: INTRAVENOUS | Status: DC

## 2020-10-28 MED ORDER — PROPOFOL 10 MG/ML IV BOLUS
INTRAVENOUS | Status: DC | PRN
  Administered 2020-10-28: 100 mg via INTRAVENOUS
  Administered 2020-10-28: 125 ug/kg/min via INTRAVENOUS

## 2020-10-28 MED ORDER — LIDOCAINE HCL (CARDIAC) PF 100 MG/5ML IV SOSY
PREFILLED_SYRINGE | INTRAVENOUS | Status: DC | PRN
  Administered 2020-10-28: 100 mg via INTRAVENOUS

## 2020-10-28 MED ORDER — STERILE WATER FOR IRRIGATION IR SOLN
Status: DC | PRN
  Administered 2020-10-28: 200 mL

## 2020-10-28 MED ORDER — LIDOCAINE HCL (PF) 2 % IJ SOLN
INTRAMUSCULAR | Status: AC
  Filled 2020-10-28: qty 5

## 2020-10-28 NOTE — Transfer of Care (Signed)
Immediate Anesthesia Transfer of Care Note  Patient: Christopher Thomas  Procedure(s) Performed: COLONOSCOPY WITH PROPOFOL POLYPECTOMY INTESTINAL  Patient Location: Short Stay  Anesthesia Type:General  Level of Consciousness: awake, alert , oriented and patient cooperative  Airway & Oxygen Therapy: Patient Spontanous Breathing  Post-op Assessment: Report given to RN, Post -op Vital signs reviewed and stable and Patient moving all extremities  Post vital signs: Reviewed and stable  Last Vitals:  Vitals Value Taken Time  BP    Temp    Pulse    Resp    SpO2      Last Pain:  Vitals:   10/28/20 0810  TempSrc:   PainSc: 0-No pain         Complications: No notable events documented.

## 2020-10-28 NOTE — Discharge Instructions (Addendum)
  Colonoscopy Discharge Instructions  Read the instructions outlined below and refer to this sheet in the next few weeks. These discharge instructions provide you with general information on caring for yourself after you leave the hospital. Your doctor may also give you specific instructions. While your treatment has been planned according to the most current medical practices available, unavoidable complications occasionally occur.   ACTIVITY You may resume your regular activity, but move at a slower pace for the next 24 hours.  Take frequent rest periods for the next 24 hours.  Walking will help get rid of the air and reduce the bloated feeling in your belly (abdomen).  No driving for 24 hours (because of the medicine (anesthesia) used during the test).   Do not sign any important legal documents or operate any machinery for 24 hours (because of the anesthesia used during the test).  NUTRITION Drink plenty of fluids.  You may resume your normal diet as instructed by your doctor.  Begin with a light meal and progress to your normal diet. Heavy or fried foods are harder to digest and may make you feel sick to your stomach (nauseated).  Avoid alcoholic beverages for 24 hours or as instructed.  MEDICATIONS You may resume your normal medications unless your doctor tells you otherwise.  WHAT YOU CAN EXPECT TODAY Some feelings of bloating in the abdomen.  Passage of more gas than usual.  Spotting of blood in your stool or on the toilet paper.  IF YOU HAD POLYPS REMOVED DURING THE COLONOSCOPY: No aspirin products for 7 days or as instructed.  No alcohol for 7 days or as instructed.  Eat a soft diet for the next 24 hours.  FINDING OUT THE RESULTS OF YOUR TEST Not all test results are available during your visit. If your test results are not back during the visit, make an appointment with your caregiver to find out the results. Do not assume everything is normal if you have not heard from your  caregiver or the medical facility. It is important for you to follow up on all of your test results.  SEEK IMMEDIATE MEDICAL ATTENTION IF: You have more than a spotting of blood in your stool.  Your belly is swollen (abdominal distention).  You are nauseated or vomiting.  You have a temperature over 101.  You have abdominal pain or discomfort that is severe or gets worse throughout the day.   Your colonoscopy revealed 2 polyp(s) which I removed successfully. Await pathology results, my office will contact you. I recommend repeating colonoscopy in 5 years for surveillance purposes.   You also have diverticulosis and internal hemorrhoids. I would recommend increasing fiber in your diet or adding OTC Benefiber/Metamucil. Be sure to drink at least 4 to 6 glasses of water daily. Follow-up with GI as needed.   I hope you have a great rest of your week!  Dwain K. Carver, D.O. Gastroenterology and Hepatology [facility]  

## 2020-10-28 NOTE — H&P (Signed)
Primary Care Physician:  Noreene Larsson, NP Primary Gastroenterologist:  Dr. Abbey Chatters  Pre-Procedure History & Physical: HPI:  Christopher Thomas is a 55 y.o. male is here for a colonoscopy for colon cancer screening purposes.  Paternal grandmother had colon cancer in her 68s. Patient denies melena or hematochezia.  No abdominal pain or unintentional weight loss.  No change in bowel habits.  Overall feels well from a GI standpoint.  Past Medical History:  Diagnosis Date   History of kidney stones    Hypertension    Kidney stones    Kidney stones 11/05/2016    Past Surgical History:  Procedure Laterality Date   APPENDECTOMY     CHOLECYSTECTOMY     lithrotripsy      Prior to Admission medications   Medication Sig Start Date End Date Taking? Authorizing Provider  amLODipine-olmesartan (AZOR) 10-40 MG tablet Take 1 tablet by mouth daily. 09/05/20  Yes Noreene Larsson, NP  polyethylene glycol-electrolytes (TRILYTE) 420 g solution Take 4,000 mLs by mouth as directed. 09/16/20   Eloise Harman, DO    Allergies as of 09/16/2020 - Review Complete 09/16/2020  Allergen Reaction Noted   Lisinopril Cough 11/05/2016    Family History  Problem Relation Age of Onset   COPD Mother    Hypertension Mother    Alcohol abuse Mother    Asthma Mother    Heart disease Mother        died at 41   Hypertension Father    Stroke Sister    Alcohol abuse Sister    Cancer Brother    Early death Brother        homicide   Colon cancer Paternal Grandmother        in her 43s    Social History   Socioeconomic History   Marital status: Significant Other    Spouse name: Pam   Number of children: 1   Years of education: 12   Highest education level: Not on file  Occupational History   Occupation: Engineer, building services    Comment: [employer]  Tobacco Use   Smoking status: Never   Smokeless tobacco: Never  Vaping Use   Vaping Use: Never used  Substance and Sexual Activity   Alcohol use: Yes     Comment: occasionally- maybe one drink per week   Drug use: No   Sexual activity: Yes    Birth control/protection: None  Other Topics Concern   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 Eyes:  Sclera clear, no icterus.   Conjunctiva pink. Ears:  Normal auditory acuity. Nose:  No deformity, discharge,  or lesions. Mouth:  No deformity or lesions, dentition normal. Neck:  Supple; no masses or thyromegaly. Lungs:  Clear throughout to auscultation.   No wheezes, crackles, or rhonchi. No acute distress. Heart:  Regular rate and rhythm; no murmurs, clicks, rubs,  or gallops. Abdomen:  Soft, nontender and nondistended. No masses, hepatosplenomegaly or hernias noted. Normal bowel sounds, without guarding, and without rebound.   Msk:  Symmetrical without gross deformities. Normal posture. Extremities:  Without clubbing or edema. Neurologic:  Alert and  oriented x4;  grossly normal neurologically. Skin:  Intact without significant lesions or rashes. Cervical Nodes:  No significant cervical adenopathy. Psych:  Alert and cooperative. Normal mood and affect.  Impression/Plan: Christopher Thomas is here for a colonoscopy to be performed for colon cancer screening purposes.  The risks of the procedure including infection, bleed, or perforation as well as benefits, limitations, alternatives  and imponderables have been reviewed with the patient. Questions have been answered. All parties agreeable.

## 2020-10-28 NOTE — Anesthesia Postprocedure Evaluation (Signed)
Anesthesia Post Note  Patient: Christopher Thomas  Procedure(s) Performed: COLONOSCOPY WITH PROPOFOL POLYPECTOMY INTESTINAL  Patient location during evaluation: Phase II Anesthesia Type: General Level of consciousness: awake and alert and oriented Pain management: pain level controlled Vital Signs Assessment: post-procedure vital signs reviewed and stable Respiratory status: spontaneous breathing and respiratory function stable Cardiovascular status: blood pressure returned to baseline and stable Postop Assessment: no apparent nausea or vomiting Anesthetic complications: no   No notable events documented.   Last Vitals:  Vitals:   10/28/20 0743 10/28/20 0835  BP:  (!) 134/46  Pulse: 85 100  Resp: 19 20  Temp: 36.7 C 36.7 C  SpO2: 97% 95%    Last Pain:  Vitals:   10/28/20 0835  TempSrc: Oral  PainSc: 0-No pain                 Rendon Howell C Tattianna Schnarr

## 2020-10-28 NOTE — Op Note (Signed)
[facility] Patient Name: Christopher Thomas Procedure Date: 10/28/2020 7:59 AM MRN: [id number] Date of Birth: [birth year] Attending MD: Elon Alas. Abbey Chatters DO CSN: [id number] Age: 55 Admit Type: Outpatient Procedure:                Colonoscopy Indications:              Screening for colorectal malignant neoplasm Providers:                Elon Alas. Abbey Chatters, DO, Crystal Page, Nelma Rothman,                            Technician Referring MD:              Medicines:                See the Anesthesia note for documentation of the                            administered medications Complications:            No immediate complications. Estimated Blood Loss:     Estimated blood loss was minimal. Procedure:                Pre-Anesthesia Assessment:                           - The anesthesia plan was to use monitored                            anesthesia care (MAC).                           After obtaining informed consent, the colonoscope                            was passed under direct vision. Throughout the                            procedure, the patient's blood pressure, pulse, and                            oxygen saturations were monitored continuously. The                            [id number]) was introduced through the anus                            and advanced to the the cecum, identified by                            appendiceal orifice and ileocecal valve. The                            colonoscopy was performed without difficulty. The                            patient tolerated the procedure well. The quality  of the bowel preparation was evaluated using the                            BBPS [facility] Bowel Preparation Scale) with scores                            of: Right Colon = 2 (minor amount of residual                            staining, small fragments of stool and/or opaque                            liquid, but mucosa seen well),  Transverse Colon = 3                            (entire mucosa seen well with no residual staining,                            small fragments of stool or opaque liquid) and Left                            Colon = 3 (entire mucosa seen well with no residual                            staining, small fragments of stool or opaque                            liquid). The total BBPS score equals 8. The quality                            of the bowel preparation was good. Scope In: 8:16:42 AM Scope Out: 8:32:18 AM Scope Withdrawal Time: 0 hours 13 minutes 52 seconds  Total Procedure Duration: 0 hours 15 minutes 36 seconds  Findings:      The perianal and digital rectal examinations were normal.      Non-bleeding internal hemorrhoids were found during endoscopy.      Multiple small-mouthed diverticula were found in the sigmoid colon and       descending colon.      A 2 mm polyp was found in the cecum. The polyp was sessile. The polyp       was removed with a cold biopsy forceps. Resection and retrieval were       complete.      A 2 mm polyp was found in the transverse colon. The polyp was sessile.       The polyp was removed with a cold biopsy forceps. Resection and       retrieval were complete. Impression:               - Non-bleeding internal hemorrhoids.                           - Diverticulosis in the sigmoid colon and in the  descending colon.                           - One 2 mm polyp in the cecum, removed with a cold                            biopsy forceps. Resected and retrieved.                           - One 2 mm polyp in the transverse colon, removed                            with a cold biopsy forceps. Resected and retrieved. Moderate Sedation:      Per Anesthesia Care Recommendation:           - Patient has a contact number available for                            emergencies. The signs and symptoms of potential                            delayed  complications were discussed with the                            patient. Return to normal activities tomorrow.                            Written discharge instructions were provided to the                            patient.                           - Resume previous diet.                           - Continue present medications.                           - Await pathology results.                           - Repeat colonoscopy in 5 years for surveillance.                           - Return to GI clinic PRN. Procedure Code(s):        --- Professional ---                           [phone], Colonoscopy, flexible; with biopsy, single                            or multiple Diagnosis Code(s):        --- Professional ---  Z12.11, Encounter for screening for malignant                            neoplasm of colon                           K64.8, Other hemorrhoids                           K63.5, Polyp of colon                           K57.30, Diverticulosis of large intestine without                            perforation or abscess without bleeding CPT copyright 2019 American Medical Association. All rights reserved. The codes documented in this report are preliminary and upon coder review may  be revised to meet current compliance requirements. Elon Alas. Abbey Chatters, DO Haakon Abbey Chatters, DO 10/28/2020 8:38:21 AM This report has been signed electronically. Number of Addenda: 0

## 2020-10-28 NOTE — Anesthesia Preprocedure Evaluation (Signed)
Anesthesia Evaluation  Patient identified by MRN, date of birth, ID band Patient awake    Reviewed: Allergy & Precautions, NPO status , Patient's Chart, lab work & pertinent test results  History of Anesthesia Complications Negative for: history of anesthetic complications  Airway Mallampati: III  TM Distance: >3 FB Neck ROM: Full    Dental  (+) Dental Advisory Given, Missing   Pulmonary sleep apnea (snoring) ,    Pulmonary exam normal breath sounds clear to auscultation       Cardiovascular hypertension, Pt. on medications Normal cardiovascular exam Rhythm:Regular Rate:Normal     Neuro/Psych negative neurological ROS  negative psych ROS   GI/Hepatic negative GI ROS, Neg liver ROS,   Endo/Other  negative endocrine ROS  Renal/GU Renal disease     Musculoskeletal negative musculoskeletal ROS (+)   Abdominal   Peds  Hematology negative hematology ROS (+)   Anesthesia Other Findings   Reproductive/Obstetrics negative OB ROS                             Anesthesia Physical Anesthesia Plan  ASA: 3  Anesthesia Plan: General   Post-op Pain Management:    Induction: Intravenous  PONV Risk Score and Plan:   Airway Management Planned: Nasal Cannula and Natural Airway  Additional Equipment:   Intra-op Plan:   Post-operative Plan:   Informed Consent: I have reviewed the patients History and Physical, chart, labs and discussed the procedure including the risks, benefits and alternatives for the proposed anesthesia with the patient or authorized representative who has indicated his/her understanding and acceptance.     Dental advisory given  Plan Discussed with: Surgeon  Anesthesia Plan Comments:         Anesthesia Quick Evaluation

## 2020-10-29 LAB — SURGICAL PATHOLOGY

## 2020-10-30 ENCOUNTER — Encounter: Payer: Self-pay | Admitting: *Deleted

## 2020-11-03 ENCOUNTER — Encounter (HOSPITAL_COMMUNITY): Payer: Self-pay | Admitting: Internal Medicine

## 2021-03-05 ENCOUNTER — Ambulatory Visit: Payer: 59 | Admitting: Nurse Practitioner

## 2021-04-15 ENCOUNTER — Other Ambulatory Visit: Payer: Self-pay

## 2021-04-15 ENCOUNTER — Ambulatory Visit (INDEPENDENT_AMBULATORY_CARE_PROVIDER_SITE_OTHER): Payer: 59 | Admitting: Nurse Practitioner

## 2021-04-15 ENCOUNTER — Encounter: Payer: Self-pay | Admitting: Nurse Practitioner

## 2021-04-15 ENCOUNTER — Encounter (INDEPENDENT_AMBULATORY_CARE_PROVIDER_SITE_OTHER): Payer: Self-pay

## 2021-04-15 VITALS — BP 166/83 | HR 79 | Temp 98.6°F | Resp 16 | Ht 67.0 in | Wt 283.1 lb

## 2021-04-15 DIAGNOSIS — R339 Retention of urine, unspecified: Secondary | ICD-10-CM | POA: Diagnosis not present

## 2021-04-15 DIAGNOSIS — I10 Essential (primary) hypertension: Secondary | ICD-10-CM | POA: Diagnosis not present

## 2021-04-15 DIAGNOSIS — R3911 Hesitancy of micturition: Secondary | ICD-10-CM

## 2021-04-15 DIAGNOSIS — R7303 Prediabetes: Secondary | ICD-10-CM

## 2021-04-15 MED ORDER — TAMSULOSIN HCL 0.4 MG PO CAPS: 0.4000 mg | ORAL_CAPSULE | Freq: Every day | ORAL | 1 refills | Status: DC

## 2021-04-15 MED ORDER — AMLODIPINE-OLMESARTAN 10-40 MG PO TABS: 1.0000 | ORAL_TABLET | Freq: Every day | ORAL | 3 refills | Status: DC

## 2021-04-15 NOTE — Assessment & Plan Note (Signed)
BP Readings from Last 3 Encounters:  04/15/21 (!) 166/83  10/28/20 (!) 134/46  09/16/20 (!) 161/98   -he didn't take his BP meds this AM; usually takes them when he wakes up for work and SBP is in 130s when medicated -no change to meds today since he didn't take them this AM; he will call if BP is > 140/90 consistently -would consider hydralazine vs diuretic (will see if flomax helps before adding diuretic)

## 2021-04-15 NOTE — Patient Instructions (Addendum)
Please have fasting labs drawn today.  I will be moving to [facility] located at [address] [place], [place] [address] effective May 10, 2021. If you would like to establish care with [facility] please call [phone], otherwise the great team at [facility] will continue to take excellent care of you.

## 2021-04-15 NOTE — Progress Notes (Signed)
Acute Office Visit  Subjective:    Patient ID: Christopher Thomas, male    DOB: 10/10/1965, 55 y.o.   MRN: [id number]  Chief Complaint  Patient presents with   Nocturia    Has been having a lot of urination at night for the past year. Can urinate and around 30 mins later he feels like he has to go again    HPI Patient is in today for lab follow-up for HTN. He states he hasn't taken his medicine today. It usually runs 130/90 at home.  He states he has incomplete bladder emptying that is worse at night.  Past Medical History:  Diagnosis Date   History of kidney stones    Hypertension    Kidney stones    Kidney stones 11/05/2016    Past Surgical History:  Procedure Laterality Date   APPENDECTOMY     CHOLECYSTECTOMY     COLONOSCOPY WITH PROPOFOL N/A 10/28/2020   Procedure: COLONOSCOPY WITH PROPOFOL;  Surgeon: Eloise Harman, DO;  [facility];  Service: Endoscopy;  Laterality: N/A;  9:00am   lithrotripsy     POLYPECTOMY  10/28/2020   Procedure: POLYPECTOMY INTESTINAL;  Surgeon: Eloise Harman, DO;  Location: [facility];  Service: Endoscopy;;    Family History  Problem Relation Age of Onset   COPD Mother    Hypertension Mother    Alcohol abuse Mother    Asthma Mother    Heart disease Mother        died at 25   Hypertension Father    Stroke Sister    Alcohol abuse Sister    Cancer Brother    Early death Brother        homicide   Colon cancer Paternal Grandmother        in her 57s    Social History   Socioeconomic History   Marital status: Significant Other    Spouse name: Pam   Number of children: 1   Years of education: 12   Highest education level: Not on file  Occupational History   Occupation: Engineer, building services    Comment: [employer]  Tobacco Use   Smoking status: Never   Smokeless tobacco: Never  Vaping Use   Vaping Use: Never used  Substance and Sexual Activity   Alcohol use: Yes    Comment: occasionally- maybe one drink per  week   Drug use: No   Sexual activity: Yes    Birth control/protection: None  Other Topics Concern   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 90 tablet 3   polyethylene glycol-electrolytes (TRILYTE) 420 g solution Take 4,000 mLs by mouth as directed. 4000 mL 0   No facility-administered medications prior to visit.    Allergies  Allergen Reactions   Lisinopril Cough    Review of Systems  Constitutional: Negative.   Respiratory: Negative.    Cardiovascular: Negative.  Elevated BP  Genitourinary:        Nocturia, incomplete emptying worse at night      Objective:    Physical Exam Constitutional:      Appearance: Normal appearance.  Cardiovascular:     Rate and Rhythm: Normal rate and regular rhythm.     Pulses: Normal pulses.     Heart sounds: Normal heart sounds.  Pulmonary:     Effort: Pulmonary effort is normal.     Breath sounds: Normal breath sounds.  Neurological:     Mental Status: He is alert.  Psychiatric:        Mood and Affect: Mood normal.        Behavior: Behavior normal.        Thought Content: Thought content normal.        Judgment: Judgment normal.    BP (!) 166/83   Pulse 79   Temp 98.6 F (37 C) (Oral)   Resp 16   Ht '5\' 7"'  (1.702 m)   Wt 283 lb 1.9 oz (128.4 kg)   SpO2 92%   BMI 44.34 kg/m  Wt Readings from Last 3 Encounters:  04/15/21 283 lb 1.9 oz (128.4 kg)  09/16/20 284 lb 12.8 oz (129.2 kg)   09/05/20 281 lb (127.5 kg)    Health Maintenance Due  Topic Date Due   TETANUS/TDAP  Never done   Zoster Vaccines- Shingrix (1 of 2) Never done   COVID-19 Vaccine (3 - Booster for Moderna series) 05/15/2020   INFLUENZA VACCINE  Never done    There are no preventive care reminders to display for this patient.   Lab Results  Component Value Date   TSH 0.931 09/09/2020   Lab Results  Component Value Date   WBC 8.0 09/09/2020   HGB 14.6 09/09/2020   HCT 45.7 09/09/2020   MCV 88 09/09/2020   PLT 215 09/09/2020   Lab Results  Component Value Date   NA 140 09/09/2020   K 4.5 09/09/2020   CO2 26 09/09/2020   GLUCOSE 89 09/09/2020   BUN 19 09/09/2020   CREATININE 1.01 09/09/2020   BILITOT 0.2 09/09/2020   ALKPHOS 81 09/09/2020   AST 20 09/09/2020   ALT 24 09/09/2020   PROT 6.5 09/09/2020   ALBUMIN 4.2 09/09/2020   CALCIUM 9.1 09/09/2020   EGFR 87 09/09/2020   Lab Results  Component Value Date   CHOL 130 09/09/2020   Lab Results  Component Value Date   HDL 47 09/09/2020   Lab Results  Component Value Date   LDLCALC 71 09/09/2020   Lab Results  Component Value Date   TRIG 54 09/09/2020   Lab Results  Component Value Date   CHOLHDL 2.5 11/05/2016   Lab Results  Component Value Date   HGBA1C 6.0 (H) 09/09/2020       Assessment & Plan:   Problem List Items Addressed This Visit       Cardiovascular and Mediastinum   Essential hypertension    BP Readings from Last 3 Encounters:  04/15/21 (!) 166/83  10/28/20 (!) 134/46  09/16/20 (!) 161/98  -he didn't take his BP meds this AM; usually takes them when he wakes up for work and SBP is in 130s when medicated -no change to meds today since he didn't take them this AM; he will call if BP is > 140/90 consistently -would consider hydralazine vs diuretic (will see if flomax helps before adding diuretic)      Relevant Medications  amLODipine-olmesartan (AZOR) 10-40 MG tablet     Other   Prediabetes     -ordered A1c with labs      Relevant Orders   Hemoglobin A1c   Urinary hesitancy    -Rx. flomax -will check PSA with labs -if no improvement in a month, he will notify office and we would consider urology referral at that point (Soon er if PSA indicates)      Relevant Medications   tamsulosin (FLOMAX) 0.4 MG CAPS capsule   Other Visit Diagnoses     Benign essential hypertension    -  Primary   Relevant Medications   amLODipine-olmesartan (AZOR) 10-40 MG tablet   Other Relevant Orders   CBC with Differential/Platelet   CMP14+EGFR   Lipid Panel With LDL/HDL Ratio   Incomplete emptying of bladder       Relevant Medications   tamsulosin (FLOMAX) 0.4 MG CAPS capsule   Other Relevant Orders   PSA        Meds ordered this encounter  Medications   tamsulosin (FLOMAX) 0.4 MG CAPS capsule    Sig: Take 1 capsule (0.4 mg total) by mouth daily.    Dispense:  90 capsule    Refill:  1   amLODipine-olmesartan (AZOR) 10-40 MG tablet    Sig: Take 1 tablet by mouth daily.    Dispense:  90 tablet    Refill:  Dickens, NP

## 2021-04-15 NOTE — Assessment & Plan Note (Signed)
-  Rx. flomax -will check PSA with labs -if no improvement in a month, he will notify office and we would consider urology referral at that point (Soon er if PSA indicates)

## 2021-04-15 NOTE — Assessment & Plan Note (Signed)
-  ordered A1c with labs

## 2021-04-16 LAB — LIPID PANEL WITH LDL/HDL RATIO
Cholesterol, Total: 144 mg/dL (ref 100–199)
HDL: 46 mg/dL (ref >39)
LDL Chol Calc (NIH): 88 mg/dL (ref 0–99)
LDL/HDL Ratio: 1.9 ratio (ref 0.0–3.6)
Triglycerides: 45 mg/dL (ref 0–149)
VLDL Cholesterol Cal: 10 mg/dL (ref 5–40)

## 2021-04-16 LAB — CMP14+EGFR
ALT: 40 IU/L (ref 0–44)
AST: 23 IU/L (ref 0–40)
Albumin/Globulin Ratio: 1.8 (ref 1.2–2.2)
Albumin: 4.2 g/dL (ref 3.8–4.9)
Alkaline Phosphatase: 79 IU/L (ref 44–121)
BUN/Creatinine Ratio: 16 (ref 9–20)
BUN: 17 mg/dL (ref 6–24)
Bilirubin Total: 0.3 mg/dL (ref 0.0–1.2)
CO2: 27 mmol/L (ref 20–29)
Calcium: 9.2 mg/dL (ref 8.7–10.2)
Chloride: 103 mmol/L (ref 96–106)
Creatinine, Ser: 1.08 mg/dL (ref 0.76–1.27)
Globulin, Total: 2.3 g/dL (ref 1.5–4.5)
Glucose: 88 mg/dL (ref 70–99)
Potassium: 4.5 mmol/L (ref 3.5–5.2)
Sodium: 143 mmol/L (ref 134–144)
Total Protein: 6.5 g/dL (ref 6.0–8.5)
eGFR: 81 mL/min/{1.73_m2} (ref >59)

## 2021-04-16 LAB — CBC WITH DIFFERENTIAL/PLATELET
Basophils Absolute: 0.1 10*3/uL (ref 0.0–0.2)
Basos: 1 %
EOS (ABSOLUTE): 0.2 10*3/uL (ref 0.0–0.4)
Eos: 2 %
Hematocrit: 45.7 % (ref 37.5–51.0)
Hemoglobin: 14.8 g/dL (ref 13.0–17.7)
Immature Grans (Abs): 0 10*3/uL (ref 0.0–0.1)
Immature Granulocytes: 0 %
Lymphocytes Absolute: 2.9 10*3/uL (ref 0.7–3.1)
Lymphs: 35 %
MCH: 28.7 pg (ref 26.6–33.0)
MCHC: 32.4 g/dL (ref 31.5–35.7)
MCV: 89 fL (ref 79–97)
Monocytes Absolute: 0.6 10*3/uL (ref 0.1–0.9)
Monocytes: 7 %
Neutrophils Absolute: 4.6 10*3/uL (ref 1.4–7.0)
Neutrophils: 55 %
Platelets: 194 10*3/uL (ref 150–450)
RBC: 5.16 x10E6/uL (ref 4.14–5.80)
RDW: 13.2 % (ref 11.6–15.4)
WBC: 8.4 10*3/uL (ref 3.4–10.8)

## 2021-04-16 LAB — PSA: Prostate Specific Ag, Serum: 0.4 ng/mL (ref 0.0–4.0)

## 2021-04-16 LAB — HEMOGLOBIN A1C
Est. average glucose Bld gHb Est-mCnc: 126 mg/dL
Hgb A1c MFr Bld: 6 % — ABNORMAL HIGH (ref 4.8–5.6)

## 2021-04-16 NOTE — Progress Notes (Signed)
Labs look great, and PSA was negative. So, flomax should help the urinary issues.

## 2021-11-02 ENCOUNTER — Ambulatory Visit (INDEPENDENT_AMBULATORY_CARE_PROVIDER_SITE_OTHER): Payer: 59 | Admitting: Family Medicine

## 2021-11-02 ENCOUNTER — Encounter: Payer: Self-pay | Admitting: Family Medicine

## 2021-11-02 VITALS — BP 148/66 | HR 107 | Ht 66.5 in | Wt 286.8 lb

## 2021-11-02 DIAGNOSIS — R7301 Impaired fasting glucose: Secondary | ICD-10-CM

## 2021-11-02 DIAGNOSIS — R059 Cough, unspecified: Secondary | ICD-10-CM

## 2021-11-02 DIAGNOSIS — Z23 Encounter for immunization: Secondary | ICD-10-CM | POA: Diagnosis not present

## 2021-11-02 DIAGNOSIS — E559 Vitamin D deficiency, unspecified: Secondary | ICD-10-CM

## 2021-11-02 DIAGNOSIS — K219 Gastro-esophageal reflux disease without esophagitis: Secondary | ICD-10-CM

## 2021-11-02 DIAGNOSIS — R0981 Nasal congestion: Secondary | ICD-10-CM

## 2021-11-02 DIAGNOSIS — I10 Essential (primary) hypertension: Secondary | ICD-10-CM

## 2021-11-02 MED ORDER — OMEPRAZOLE 20 MG PO CPDR: 20.0000 mg | DELAYED_RELEASE_CAPSULE | Freq: Every day | ORAL | 1 refills | Status: DC

## 2021-11-02 MED ORDER — FLUTICASONE PROPIONATE 50 MCG/ACT NA SUSP: 2.0000 | Freq: Every day | NASAL | 6 refills | Status: DC

## 2021-11-02 MED ORDER — BENZONATATE 100 MG PO CAPS: 100.0000 mg | ORAL_CAPSULE | Freq: Two times a day (BID) | ORAL | 0 refills | Status: DC | PRN

## 2021-11-02 NOTE — Assessment & Plan Note (Signed)
-  Uncontrolled -He reports not taking his BP medicine consistently because he's been trying to figure out if his persistent cough is from his BP medications -Will start a trial of PPI to assess improvement in symptoms -Tessalon pearls ordered -Flonase ordered for congestion -Encouraged to continue taking BP med with f/u in 1 month

## 2021-11-02 NOTE — Progress Notes (Signed)
Established Patient Office Visit  Subjective:  Patient ID: Christopher Thomas, male    DOB: 18-Aug-1965  Age: 56 y.o. MRN: [id number]  CC:  Chief Complaint  Patient presents with   Cough    Pt c/o persistent cough onset for 6 months now.     HPI Christopher Thomas is a 56 y.o. male with past medical history of essential hypertension presents with c/o of persistent dry cough for > 6 months.   HTN:  BP elevated today. He reports not taking his BP medicine consistently because he's been trying to figure out if his persistent cough is from his BP medications.    Past Medical History:  Diagnosis Date   History of kidney stones    Hypertension    Kidney stones    Kidney stones 11/05/2016    Past Surgical History:  Procedure Laterality Date   APPENDECTOMY     CHOLECYSTECTOMY     COLONOSCOPY WITH PROPOFOL N/A 10/28/2020   Procedure: COLONOSCOPY WITH PROPOFOL;  Surgeon: Lanelle Bal, DO;  Location: [facility];  Service: Endoscopy;  Laterality: N/A;  9:00am   lithrotripsy     POLYPECTOMY  10/28/2020   Procedure: POLYPECTOMY INTESTINAL;  Surgeon: Lanelle Bal, DO;  Location: [facility];  Service: Endoscopy;;    Family History  Problem Relation Age of Onset   COPD Mother    Hypertension Mother    Alcohol abuse Mother    Asthma Mother    Heart disease Mother        died at 73   Hypertension Father    Stroke Sister    Alcohol abuse Sister    Cancer Brother    Early death Brother        homicide   Colon cancer Paternal Grandmother        in her 67s    Social History   Socioeconomic History   Marital status: Significant Other    Spouse name: Pam   Number of children: 1   Years of education: 12   Highest education level: Not on file  Occupational History   Occupation: Games developer    Comment: [employer]  Tobacco Use   Smoking status: Never   Smokeless tobacco: Never  Vaping Use   Vaping Use: Never used  Substance and Sexual Activity   Alcohol  use: Yes    Comment: occasionally- maybe one drink per week   Drug use: No   Sexual activity: Yes    Birth control/protection: None  Other Topics Concern   Not on file  Social History Narrative   Lives alone   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 90 tablet 3   tamsulosin (FLOMAX) 0.4 MG CAPS capsule Take 1 capsule (0.4 mg total) by mouth daily. 90 capsule 1   No facility-administered medications prior to visit.    Allergies  Allergen Reactions   Lisinopril Cough    ROS Review of Systems  Constitutional:  Negative  for diaphoresis, fatigue and fever.  HENT:  Positive for congestion. Negative for rhinorrhea, sinus pressure, sinus pain and sneezing.   Respiratory:  Positive for cough.   Cardiovascular:  Negative for chest pain and palpitations.  Neurological:  Negative for dizziness, tremors, weakness and headaches.      Objective:    Physical Exam HENT:     Head: Normocephalic.     Nose: Congestion present.  Cardiovascular:     Rate and Rhythm: Normal rate and regular rhythm.     Pulses: Normal pulses.     Heart sounds: Normal heart sounds.  Pulmonary:     Effort: Pulmonary effort is normal.     Breath sounds: Normal breath sounds.  Abdominal:     Palpations: Abdomen is soft.  Musculoskeletal:     Cervical back: No rigidity.  Neurological:     Mental Status: He is alert.     BP (!) 148/66 (BP Location: Left Arm)   Pulse (!) 107   Ht 5' 6.5" (1.689 m)   Wt 286 lb 12.8 oz  (130.1 kg)   SpO2 93%   BMI 45.60 kg/m  Wt Readings from Last 3 Encounters:  11/02/21 286 lb 12.8 oz (130.1 kg)  04/15/21 283 lb 1.9 oz (128.4 kg)  09/16/20 284 lb 12.8 oz (129.2 kg)    Lab Results  Component Value Date   TSH 0.931 09/09/2020   Lab Results  Component Value Date   WBC 8.4 04/15/2021   HGB 14.8 04/15/2021   HCT 45.7 04/15/2021   MCV 89 04/15/2021   PLT 194 04/15/2021   Lab Results  Component Value Date   NA 143 04/15/2021   K 4.5 04/15/2021   CO2 27 04/15/2021   GLUCOSE 88 04/15/2021   BUN 17 04/15/2021   CREATININE 1.08 04/15/2021   BILITOT 0.3 04/15/2021   ALKPHOS 79 04/15/2021   AST 23 04/15/2021   ALT 40 04/15/2021   PROT 6.5 04/15/2021   ALBUMIN 4.2 04/15/2021   CALCIUM 9.2 04/15/2021   EGFR 81 04/15/2021   Lab Results  Component Value Date   CHOL 144 04/15/2021   Lab Results  Component Value Date   HDL 46 04/15/2021   Lab Results  Component Value Date   LDLCALC 88 04/15/2021   Lab Results  Component Value Date   TRIG 45 04/15/2021   Lab Results  Component Value Date   CHOLHDL 2.5 11/05/2016   Lab Results  Component Value Date   HGBA1C 6.0 (H) 04/15/2021      Assessment & Plan:   Problem List Items Addressed This Visit       Cardiovascular and Mediastinum   Essential hypertension    -Uncontrolled -He reports not taking his BP medicine consistently because he's been trying to figure out if his persistent cough is from his BP medications -Will start a trial of PPI to assess improvement in symptoms -Tessalon pearls ordered -Flonase ordered for congestion -Encouraged to continue taking BP med with f/u in 1 month        Relevant Orders   CBC   CMP14+EGFR   TSH + free T4   Lipid panel     Other   Vitamin D deficiency   Relevant Orders   Vitamin D (25 hydroxy)   Immunization due - Primary   Relevant Orders   Tdap vaccine greater than or equal to 7yo IM   Varicella-zoster vaccine IM   Other Visit Diagnoses      Nasal congestion  Relevant Medications   fluticasone (FLONASE) 50 MCG/ACT nasal spray   Cough in adult       Relevant Medications   benzonatate (TESSALON) 100 MG capsule   omeprazole (PRILOSEC) 20 MG capsule   IFG (impaired fasting glucose)       Relevant Orders   Hemoglobin A1C   Gastroesophageal reflux disease without esophagitis       Relevant Medications   omeprazole (PRILOSEC) 20 MG capsule       Meds ordered this encounter  Medications   benzonatate (TESSALON) 100 MG capsule    Sig: Take 1 capsule (100 mg total) by mouth 2 (two) times daily as needed for cough.    Dispense:  20 capsule    Refill:  0   fluticasone (FLONASE) 50 MCG/ACT nasal spray    Sig: Place 2 sprays into both nostrils daily.    Dispense:  16 g    Refill:  6   omeprazole (PRILOSEC) 20 MG capsule    Sig: Take 1 capsule (20 mg total) by mouth daily.    Dispense:  30 capsule    Refill:  1    Follow-up: Return in about 1 month (around 12/02/2021).    Gilmore Laroche, FNP

## 2021-12-07 ENCOUNTER — Encounter: Payer: Self-pay | Admitting: Family Medicine

## 2021-12-07 ENCOUNTER — Ambulatory Visit (INDEPENDENT_AMBULATORY_CARE_PROVIDER_SITE_OTHER): Payer: 59 | Admitting: Family Medicine

## 2021-12-07 VITALS — BP 144/84 | HR 93 | Ht 66.5 in | Wt 285.1 lb

## 2021-12-07 DIAGNOSIS — Z23 Encounter for immunization: Secondary | ICD-10-CM

## 2021-12-07 DIAGNOSIS — I10 Essential (primary) hypertension: Secondary | ICD-10-CM

## 2021-12-07 DIAGNOSIS — R7301 Impaired fasting glucose: Secondary | ICD-10-CM

## 2021-12-07 DIAGNOSIS — E559 Vitamin D deficiency, unspecified: Secondary | ICD-10-CM | POA: Diagnosis not present

## 2021-12-07 MED ORDER — OLMESARTAN-AMLODIPINE-HCTZ 40-10-12.5 MG PO TABS: 1.0000 | ORAL_TABLET | Freq: Every day | ORAL | 2 refills | Status: DC

## 2021-12-07 NOTE — Progress Notes (Signed)
Established Patient Office Visit  Subjective:  Patient ID: Christopher Thomas, male    DOB: [birth year]  Age: 56 y.o. MRN: [id number]  CC:  Chief Complaint  Patient presents with   Follow-up    Pt following up, pt has no complaints states he has been taking his bp medication.     HPI Christopher Thomas is a 56 y.o. male with past medical history of HTN presents for f/u of  chronic medical conditions. HTN: Uncontrolled. Reports eating a lot of pork and admits to eating pork for dinner yesterday, 12/07/21. He mentions that his girlfriend adds added salt when she cooks. He notes prolonged standing at work with swelling of his feet. No headaches, dizziness and blurred vision.   He received his Tdap and Shingles vaccine today  Past Medical History:  Diagnosis Date   History of kidney stones    Hypertension    Kidney stones    Kidney stones 11/05/2016    Past Surgical History:  Procedure Laterality Date   APPENDECTOMY     CHOLECYSTECTOMY     COLONOSCOPY WITH PROPOFOL N/A 10/28/2020   Procedure: COLONOSCOPY WITH PROPOFOL;  Surgeon: Eloise Harman, DO;  Location: [facility];  Service: Endoscopy;  Laterality: N/A;  9:00am   lithrotripsy     POLYPECTOMY  10/28/2020   Procedure: POLYPECTOMY INTESTINAL;  Surgeon: Eloise Harman, DO;  Location: [facility];  Service: Endoscopy;;    Family History  Problem Relation Age of Onset   COPD Mother    Hypertension Mother    Alcohol abuse Mother    Asthma Mother    Heart disease Mother        died at 53   Hypertension Father    Stroke Sister    Alcohol abuse Sister    Cancer Brother    Early death Brother        homicide   Colon cancer Paternal Grandmother        in her 22s    Social History   Socioeconomic History   Marital status: Significant Other    Spouse name: Pam   Number of children: 1   Years of education: 12   Highest education level: Not on file  Occupational History   Occupation: Engineer, building services    Comment:  [employer]  Tobacco Use   Smoking status: Never   Smokeless tobacco: Never  Vaping Use   Vaping Use: Never used  Substance and Sexual Activity   Alcohol use: Yes    Comment: occasionally- maybe one drink per week   Drug use: No   Sexual activity: Yes    Birth control/protection: None  Other Topics Concern   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 20 capsule 0   fluticasone (FLONASE) 50 MCG/ACT nasal spray Place 2 sprays into both nostrils daily. [place]  g 6   omeprazole (PRILOSEC) 20 MG capsule Take 1 capsule (20 mg total) by mouth daily. 30 capsule 1   tamsulosin (FLOMAX) 0.4 MG CAPS capsule Take 1 capsule (0.4 mg total) by mouth daily. 90 capsule 1   amLODipine-olmesartan (AZOR) 10-40 MG tablet Take 1 tablet by mouth daily. 90 tablet 3   No facility-administered medications prior to visit.    Allergies  Allergen Reactions   Lisinopril Cough    ROS Review of Systems  Constitutional:  Negative for fatigue and fever.  Gastrointestinal:  Negative for nausea and vomiting.  Neurological:  Negative for dizziness, light-headedness, numbness and headaches.      Objective:    Physical Exam HENT:     Head: Normocephalic.  Cardiovascular:     Rate and Rhythm: Normal rate and regular rhythm.     Pulses: Normal pulses.     Heart sounds: Normal heart sounds.  Pulmonary:     Effort: Pulmonary effort  is normal.     Breath sounds: Normal breath sounds.  Musculoskeletal:     Right lower leg: Edema (trace) present.     Left lower leg: Edema (trace) present.  Neurological:     Mental Status: He is alert.     BP (!) 144/84   Pulse 93   Ht 5' 6.5" (1.689 m)   Wt 285 lb 1.6 oz (129.3 kg)   SpO2 93%   BMI 45.33 kg/m  Wt Readings from Last 3 Encounters:  12/07/21 285 lb 1.6 oz (129.3 kg)  11/02/21 286 lb 12.8 oz (130.1 kg)  04/15/21 283 lb 1.9 oz (128.4 kg)    Lab Results  Component Value Date   TSH 0.931 09/09/2020   Lab Results  Component Value Date   WBC 8.4 04/15/2021   HGB 14.8 04/15/2021   HCT 45.7 04/15/2021   MCV 89 04/15/2021   PLT 194 04/15/2021   Lab Results  Component Value Date   NA 143 04/15/2021   K 4.5 04/15/2021   CO2 27 04/15/2021   GLUCOSE 88 04/15/2021   BUN 17 04/15/2021   CREATININE 1.08 04/15/2021   BILITOT 0.3 04/15/2021   ALKPHOS 79 04/15/2021   AST 23 04/15/2021   ALT 40 04/15/2021   PROT 6.5 04/15/2021   ALBUMIN 4.2 04/15/2021   CALCIUM 9.2 04/15/2021   EGFR 81 04/15/2021   Lab Results  Component Value Date   CHOL 144 04/15/2021   Lab Results  Component Value Date   HDL 46 04/15/2021   Lab Results  Component Value Date   LDLCALC 88 04/15/2021   Lab Results  Component Value Date   TRIG 45 04/15/2021   Lab Results  Component Value Date   CHOLHDL 2.5 11/05/2016   Lab Results  Component Value Date   HGBA1C 6.0 (H) 04/15/2021      Assessment & Plan:   Problem List Items Addressed This Visit       Cardiovascular and Mediastinum   Essential hypertension - Primary    Uncontrolled Reports eating a lot of pork and admits to eating pork for dinner yesterday, 12/07/21 He mentions that his girlfriend adds added salt when she cooks He notes prolonged standing at work with swelling of his feet No headaches, dizziness, and blurred vision Will add HCTZ 12.5 to his medication regimen Inform him to limit his salt intake to  1,500-2,000 mg daily Reviewed dash eating plan Follow up in 1 month      Relevant Medications   Olmesartan-amLODIPine-HCTZ 40-10-12.5  MG TABS   Other Relevant Orders   CBC with Differential/Platelet   CMP14+EGFR   TSH + free T4   Lipid Profile     Other   Vitamin D deficiency   Relevant Orders   VITAMIN D 25 Hydroxy (Vit-D Deficiency, Fractures)   Immunization due   Relevant Orders   Tdap vaccine greater than or equal to 7yo IM   Varicella-zoster vaccine IM   Other Visit Diagnoses     IFG (impaired fasting glucose)       Relevant Orders   Hemoglobin A1C       Meds ordered this encounter  Medications   Olmesartan-amLODIPine-HCTZ 40-10-12.5 MG TABS    Sig: Take 1 tablet by mouth daily.    Dispense:  90 tablet    Refill:  2    Follow-up: Return in about 1 month (around 01/07/2022) for BP.    Gloria  Zarwolo, FNP 

## 2021-12-07 NOTE — Assessment & Plan Note (Signed)
Uncontrolled Reports eating a lot of pork and admits to eating pork for dinner yesterday, 12/07/21 He mentions that his girlfriend adds added salt when she cooks He notes prolonged standing at work with swelling of his feet No headaches, dizziness, and blurred vision Will add HCTZ 12.5 to his medication regimen Inform him to limit his salt intake to 1,500-2,000 mg daily Reviewed dash eating plan Follow up in 1 month

## 2021-12-07 NOTE — Patient Instructions (Addendum)
I appreciate the opportunity to provide care to you today!    Follow up:  1 months   Labs: please stop by the lab today to get your blood drawn (CBC, CMP, TSH, Lipid profile, HgA1c, Vit D)   Thank you for getting  your Tdap and Shingles vaccine today    Please continue to a heart-healthy diet and increase your physical activities. Try to exercise for 40mns at least three times a week.      It was a pleasure to see you and I look forward to continuing to work together on your health and well-being. Please do not hesitate to call the office if you need care or have questions about your care.   Have a wonderful day and week. With Gratitude, GAlvira MondayMSN, FNP-BC   Limit your salt intake  to 1,500-2,000 mg (milligrams) of sodium each day.    Please continue to a heart-healthy diet and increase your physical activities. Try to exercise for 351ms at least three times a week.      It was a pleasure to see you and I look forward to continuing to work together on your health and well-being. Please do not hesitate to call the office if you need care or have questions about your care.   Have a wonderful day and week. With Gratitude, GlAlvira MondaySN, FNP-BC

## 2021-12-08 ENCOUNTER — Other Ambulatory Visit: Payer: Self-pay | Admitting: Family Medicine

## 2021-12-08 DIAGNOSIS — E559 Vitamin D deficiency, unspecified: Secondary | ICD-10-CM

## 2021-12-08 LAB — CMP14+EGFR
ALT: 25 IU/L (ref 0–44)
AST: 19 IU/L (ref 0–40)
Albumin/Globulin Ratio: 1.8 (ref 1.2–2.2)
Albumin: 4.2 g/dL (ref 3.8–4.9)
Alkaline Phosphatase: 82 IU/L (ref 44–121)
BUN/Creatinine Ratio: 17 (ref 9–20)
BUN: 20 mg/dL (ref 6–24)
Bilirubin Total: 0.5 mg/dL (ref 0.0–1.2)
CO2: 26 mmol/L (ref 20–29)
Calcium: 9.1 mg/dL (ref 8.7–10.2)
Chloride: 103 mmol/L (ref 96–106)
Creatinine, Ser: 1.2 mg/dL (ref 0.76–1.27)
Globulin, Total: 2.4 g/dL (ref 1.5–4.5)
Glucose: 86 mg/dL (ref 70–99)
Potassium: 4.4 mmol/L (ref 3.5–5.2)
Sodium: 141 mmol/L (ref 134–144)
Total Protein: 6.6 g/dL (ref 6.0–8.5)
eGFR: 71 mL/min/{1.73_m2} (ref >59)

## 2021-12-08 LAB — CBC WITH DIFFERENTIAL/PLATELET
Basophils Absolute: 0 10*3/uL (ref 0.0–0.2)
Basos: 0 %
EOS (ABSOLUTE): 0.2 10*3/uL (ref 0.0–0.4)
Eos: 2 %
Hematocrit: 46 % (ref 37.5–51.0)
Hemoglobin: 15.4 g/dL (ref 13.0–17.7)
Immature Grans (Abs): 0 10*3/uL (ref 0.0–0.1)
Immature Granulocytes: 0 %
Lymphocytes Absolute: 2.8 10*3/uL (ref 0.7–3.1)
Lymphs: 31 %
MCH: 28.6 pg (ref 26.6–33.0)
MCHC: 33.5 g/dL (ref 31.5–35.7)
MCV: 86 fL (ref 79–97)
Monocytes Absolute: 0.6 10*3/uL (ref 0.1–0.9)
Monocytes: 7 %
Neutrophils Absolute: 5.4 10*3/uL (ref 1.4–7.0)
Neutrophils: 60 %
Platelets: 200 10*3/uL (ref 150–450)
RBC: 5.38 x10E6/uL (ref 4.14–5.80)
RDW: 13.2 % (ref 11.6–15.4)
WBC: 9.1 10*3/uL (ref 3.4–10.8)

## 2021-12-08 LAB — VITAMIN D 25 HYDROXY (VIT D DEFICIENCY, FRACTURES): Vit D, 25-Hydroxy: 23.8 ng/mL — ABNORMAL LOW (ref 30.0–100.0)

## 2021-12-08 LAB — LIPID PANEL
Chol/HDL Ratio: 3.1 ratio (ref 0.0–5.0)
Cholesterol, Total: 141 mg/dL (ref 100–199)
HDL: 45 mg/dL (ref >39)
LDL Chol Calc (NIH): 84 mg/dL (ref 0–99)
Triglycerides: 55 mg/dL (ref 0–149)
VLDL Cholesterol Cal: 12 mg/dL (ref 5–40)

## 2021-12-08 LAB — HEMOGLOBIN A1C
Est. average glucose Bld gHb Est-mCnc: 120 mg/dL
Hgb A1c MFr Bld: 5.8 % — ABNORMAL HIGH (ref 4.8–5.6)

## 2021-12-08 LAB — TSH+FREE T4
Free T4: 1.25 ng/dL (ref 0.82–1.77)
TSH: 0.751 u[IU]/mL (ref 0.450–4.500)

## 2021-12-08 MED ORDER — VITAMIN D (ERGOCALCIFEROL) 1.25 MG (50000 UNIT) PO CAPS: 50000.0000 [IU] | ORAL_CAPSULE | ORAL | 1 refills | Status: DC

## 2021-12-08 NOTE — Progress Notes (Signed)
Please inform the patient that a prescription for Vit D has been sent to his pharmacy. His Vit D is low. Inform the patient hat he has prediabetes. I recommend low calorie and sugar diet with increased physical activities.

## 2022-01-07 ENCOUNTER — Ambulatory Visit (INDEPENDENT_AMBULATORY_CARE_PROVIDER_SITE_OTHER): Payer: 59 | Admitting: Family Medicine

## 2022-01-07 ENCOUNTER — Encounter: Payer: Self-pay | Admitting: Family Medicine

## 2022-01-07 VITALS — BP 146/82 | HR 88 | Ht 66.5 in | Wt 280.0 lb

## 2022-01-07 DIAGNOSIS — I10 Essential (primary) hypertension: Secondary | ICD-10-CM | POA: Diagnosis not present

## 2022-01-07 MED ORDER — OLMESARTAN-AMLODIPINE-HCTZ 40-10-25 MG PO TABS: 1.0000 | ORAL_TABLET | Freq: Every day | ORAL | 1 refills | Status: DC

## 2022-01-07 NOTE — Progress Notes (Signed)
Established Patient Office Visit  Subjective:  Patient ID: Christopher Thomas, male    DOB: December 29, 1965  Age: 56 y.o. MRN: [id number]  CC:  Chief Complaint  Patient presents with   Follow-up    1 month f/u for blood pressure, pt states he forgot to take medication, states he has been taking medication consistently since last visit.     HPI Christopher Thomas is a 56 y.o. male with past medical history of HTN presents for f/u of  chronic medical conditions.  HTN:  Uncontrolled. He takes his BP medication at 2:30 a.m. and reports not taking it today. He said that he was in a hurry this morning. He reports that his  ambulatory systolic Bp are in the 166A. He denies headaches and dizziness but reports occasionally having spots in his vision. He does have an appointment with the optometrist for an eye exam.     Past Medical History:  Diagnosis Date   History of kidney stones    Hypertension    Kidney stones    Kidney stones 11/05/2016    Past Surgical History:  Procedure Laterality Date   APPENDECTOMY     CHOLECYSTECTOMY     COLONOSCOPY WITH PROPOFOL N/A 10/28/2020   Procedure: COLONOSCOPY WITH PROPOFOL;  Surgeon: Eloise Harman, DO;  Location: [facility];  Service: Endoscopy;  Laterality: N/A;  9:00am   lithrotripsy     POLYPECTOMY  10/28/2020   Procedure: POLYPECTOMY INTESTINAL;  Surgeon: Eloise Harman, DO;  Location: [facility];  Service: Endoscopy;;    Family History  Problem Relation Age of Onset   COPD Mother    Hypertension Mother    Alcohol abuse Mother    Asthma Mother    Heart disease Mother        died at 45   Hypertension Father    Stroke Sister    Alcohol abuse Sister    Cancer Brother    Early death Brother        homicide   Colon cancer Paternal Grandmother        in her 19s    Social History   Socioeconomic History   Marital status: Significant Other    Spouse name: Pam   Number of children: 1   Years of education: 12   Highest  education level: Not on file  Occupational History   Occupation: Engineer, building services    Comment: [employer]  Tobacco Use   Smoking status: Never   Smokeless tobacco: Never  Vaping Use   Vaping Use: Never used  Substance and Sexual Activity   Alcohol use: Yes    Comment: occasionally- maybe one drink per week   Drug use: No   Sexual activity: Yes    Birth control/protection: None  Other Topics Concern   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 16 g 6   omeprazole (  PRILOSEC) 20 MG capsule Take 1 capsule (20 mg total) by mouth daily. 30 capsule 1   tamsulosin (FLOMAX) 0.4 MG CAPS capsule Take 1 capsule (0.4 mg total) by mouth daily. 90 capsule 1   Olmesartan-amLODIPine-HCTZ 40-10-12.5 MG TABS Take 1 tablet by mouth daily. 90 tablet 2   Vitamin D, Ergocalciferol, (DRISDOL) 1.25 MG (50000 UNIT) CAPS capsule Take 1 capsule (50,000 Units total) by mouth every 7 (seven) days. (Patient not taking: Reported on 01/07/2022) 5 capsule 1   benzonatate (TESSALON) 100 MG capsule Take 1 capsule (100 mg total) by mouth 2 (two) times daily as needed for cough. 20 capsule 0   No facility-administered medications prior to visit.    Allergies  Allergen Reactions   Lisinopril Cough    ROS Review of Systems  Constitutional:  Negative for fever.  Eyes:  Positive for visual disturbance.  Respiratory:  Negative for chest tightness and shortness of breath.    Cardiovascular:  Negative for chest pain and palpitations.  Neurological:  Negative for dizziness and headaches.      Objective:    Physical Exam HENT:     Head: Normocephalic.  Cardiovascular:     Rate and Rhythm: Normal rate and regular rhythm.     Pulses: Normal pulses.     Heart sounds: Normal heart sounds.  Pulmonary:     Effort: Pulmonary effort is normal.     Breath sounds: Normal breath sounds.  Musculoskeletal:     Right lower leg: No edema.     Left lower leg: No edema.  Neurological:     Mental Status: He is alert.     BP (!) 146/82   Pulse 88   Ht 5' 6.5" (1.689 m)   Wt 280 lb (127 kg)   SpO2 93%   BMI 44.52 kg/m  Wt Readings from Last 3 Encounters:  01/07/22 280 lb (127 kg)  12/07/21 285 lb 1.6 oz (129.3 kg)  11/02/21 286 lb 12.8 oz (130.1 kg)    Lab Results  Component Value Date   TSH 0.751 12/07/2021   Lab Results  Component Value Date   WBC 9.1 12/07/2021   HGB 15.4 12/07/2021   HCT 46.0 12/07/2021   MCV 86 12/07/2021   PLT 200 12/07/2021   Lab Results  Component Value Date   NA 141 12/07/2021   K 4.4 12/07/2021   CO2 26 12/07/2021   GLUCOSE 86 12/07/2021   BUN 20 12/07/2021   CREATININE 1.20 12/07/2021   BILITOT 0.5 12/07/2021   ALKPHOS 82 12/07/2021   AST 19 12/07/2021   ALT 25 12/07/2021   PROT 6.6 12/07/2021   ALBUMIN 4.2 12/07/2021   CALCIUM 9.1 12/07/2021   EGFR 71 12/07/2021   Lab Results  Component Value Date   CHOL 141 12/07/2021   Lab Results  Component Value Date   HDL 45 12/07/2021   Lab Results  Component Value Date   LDLCALC 84 12/07/2021   Lab Results  Component Value Date   TRIG 55 12/07/2021   Lab Results  Component Value Date   CHOLHDL 3.1 12/07/2021   Lab Results  Component Value Date   HGBA1C 5.8 (H) 12/07/2021      Assessment & Plan:   Problem List Items Addressed This Visit       Cardiovascular and Mediastinum   Essential hypertension - Primary    Uncontrolled The patient has  lost 4 lbs since his last visit He reports increasing his physical activities and decreasing his  salt intake Will increase the dose of HTC to 25 mg  Will d/c olmesartan-amlodipine-HCT 40-10-12.5  Encouraged pt to start taking olmesartan-amlodipine-HCT 40-10-25 Encouraged to continue dash eating diet and limit his salt intake       Relevant Medications   Olmesartan-amLODIPine-HCTZ 40-10-25 MG TABS    Meds ordered this encounter  Medications   Olmesartan-amLODIPine-HCTZ 40-10-25 MG TABS    Sig: Take 1 tablet by mouth daily.    Dispense:  30 tablet    Refill:  1    Follow-up: Return in about 1 month (around 02/06/2022) for BP.    Alvira Monday, FNP

## 2022-01-07 NOTE — Patient Instructions (Signed)
I appreciate the opportunity to provide care to you today!    Follow up:  1 months  Please pick up your medication at the pharmacy   Please continue to a heart-healthy diet and increase your physical activities. Try to exercise for 30mins at least three times a week.      It was a pleasure to see you and I look forward to continuing to work together on your health and well-being. Please do not hesitate to call the office if you need care or have questions about your care.   Have a wonderful day and week. With Gratitude, Steed Kanaan MSN, FNP-BC  

## 2022-01-07 NOTE — Assessment & Plan Note (Addendum)
Uncontrolled The patient has lost 4 lbs since his last visit He reports increasing his physical activities and decreasing his salt intake Will increase the dose of HTC to 25 mg  Will d/c olmesartan-amlodipine-HCT 40-10-12.5  Encouraged pt to start taking olmesartan-amlodipine-HCT 40-10-25 Encouraged to continue dash eating diet and limit his salt intake

## 2022-02-11 ENCOUNTER — Ambulatory Visit (INDEPENDENT_AMBULATORY_CARE_PROVIDER_SITE_OTHER): Payer: 59 | Admitting: Family Medicine

## 2022-02-11 ENCOUNTER — Encounter: Payer: Self-pay | Admitting: Family Medicine

## 2022-02-11 VITALS — BP 124/72 | HR 77 | Ht 66.5 in | Wt 280.0 lb

## 2022-02-11 DIAGNOSIS — E559 Vitamin D deficiency, unspecified: Secondary | ICD-10-CM | POA: Diagnosis not present

## 2022-02-11 DIAGNOSIS — Z23 Encounter for immunization: Secondary | ICD-10-CM | POA: Diagnosis not present

## 2022-02-11 DIAGNOSIS — R059 Cough, unspecified: Secondary | ICD-10-CM

## 2022-02-11 DIAGNOSIS — Z6841 Body Mass Index (BMI) 40.0 and over, adult: Secondary | ICD-10-CM

## 2022-02-11 DIAGNOSIS — K219 Gastro-esophageal reflux disease without esophagitis: Secondary | ICD-10-CM

## 2022-02-11 DIAGNOSIS — I10 Essential (primary) hypertension: Secondary | ICD-10-CM

## 2022-02-11 MED ORDER — VITAMIN D (ERGOCALCIFEROL) 1.25 MG (50000 UNIT) PO CAPS: 50000.0000 [IU] | ORAL_CAPSULE | ORAL | 2 refills | Status: DC

## 2022-02-11 MED ORDER — OLMESARTAN-AMLODIPINE-HCTZ 40-10-25 MG PO TABS: 1.0000 | ORAL_TABLET | Freq: Every day | ORAL | 1 refills | Status: DC

## 2022-02-11 MED ORDER — OMEPRAZOLE 20 MG PO CPDR: 20.0000 mg | DELAYED_RELEASE_CAPSULE | Freq: Every day | ORAL | 1 refills | Status: DC

## 2022-02-11 NOTE — Assessment & Plan Note (Signed)
Patient educated on CDC recommendation for the vaccine. Verbal consent was obtained from the patient, vaccine administered by nurse, no sign of adverse reactions noted at this time. Patient education on arm soreness and use of tylenol or ibuprofen for this patient  was discussed. Patient educated on the signs and symptoms of adverse effect and advise to contact the office if they occur.  

## 2022-02-11 NOTE — Patient Instructions (Signed)
I appreciate the opportunity to provide care to you today!    Follow up:  3 months   Please pick up your refills at the pharmacy    Please continue to a heart-healthy diet and increase your physical activities. Try to exercise for 30mins at least three times a week.      It was a pleasure to see you and I look forward to continuing to work together on your health and well-being. Please do not hesitate to call the office if you need care or have questions about your care.   Have a wonderful day and week. With Gratitude, Emali Heyward MSN, FNP-BC  

## 2022-02-11 NOTE — Progress Notes (Signed)
Established Patient Office Visit  Subjective:  Patient ID: Christopher Thomas, male    DOB: July 19, 1965  Age: 56 y.o. MRN: [id number]  CC:  Chief Complaint  Patient presents with   Follow-up    Pt here for f/u on bp, states he has been doing well.     HPI Christopher Thomas is a 56 y.o. male with past medical history of hypertension, BPH, obesity presents for f/u of  chronic medical conditions.  Hypertension: He takes olmesartan-amlodipine-HCTZ 40 - 10 - 25 mg daily.  He reports adherence to treatment regimen.  He denies headaches, dizziness, blurred vision, chest pain, palpitation, and shortness of breath.  He reports adherence to low-sodium diet.  Obesity: He reports that he wants to obtain a weight of 220.  He reports needing to cut out sodas and increasing his physical activities.   Will refill his vitamin D supplement, Prilosec, and his blood pressure medication today.  Past Medical History:  Diagnosis Date   History of kidney stones    Hypertension    Kidney stones    Kidney stones 11/05/2016    Past Surgical History:  Procedure Laterality Date   APPENDECTOMY     CHOLECYSTECTOMY     COLONOSCOPY WITH PROPOFOL N/A 10/28/2020   Procedure: COLONOSCOPY WITH PROPOFOL;  Surgeon: Eloise Harman, DO;  Location: [facility];  Service: Endoscopy;  Laterality: N/A;  9:00am   lithrotripsy     POLYPECTOMY  10/28/2020   Procedure: POLYPECTOMY INTESTINAL;  Surgeon: Eloise Harman, DO;  Location: [facility];  Service: Endoscopy;;    Family History  Problem Relation Age of Onset   COPD Mother    Hypertension Mother    Alcohol abuse Mother    Asthma Mother    Heart disease Mother        died at 31   Hypertension Father    Stroke Sister    Alcohol abuse Sister    Cancer Brother    Early death Brother        homicide   Colon cancer Paternal Grandmother        in her 23s    Social History   Socioeconomic History   Marital status: Significant Other    Spouse name:  Pam   Number of children: 1   Years of education: 12   Highest education level: Not on file  Occupational History   Occupation: Engineer, building services    Comment: [employer]  Tobacco Use   Smoking status: Never   Smokeless tobacco: Never  Vaping Use   Vaping Use: Never used  Substance and Sexual Activity   Alcohol use: Yes    Comment: occasionally- maybe one drink per week   Drug use: No   Sexual activity: Yes    Birth control/protection: None  Other Topics Concern   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  90 capsule 1   Olmesartan-amLODIPine-HCTZ 40-10-25 MG TABS Take 1 tablet by mouth daily. 30 tablet 1   omeprazole (PRILOSEC) 20 MG capsule Take 1 capsule (20 mg total) by mouth daily. 30 capsule 1   Vitamin D, Ergocalciferol, (DRISDOL) 1.25 MG (50000 UNIT) CAPS capsule Take 1 capsule (50,000 Units total) by mouth every 7 (seven) days. 5 capsule 1   fluticasone (FLONASE) 50 MCG/ACT nasal spray Place 2 sprays into both nostrils daily. (Patient not taking: Reported on 02/11/2022) 16 g 6   No facility-administered medications prior to visit.    Allergies  Allergen Reactions   Lisinopril Cough    ROS Review of Systems  Constitutional:  Negative for fatigue and fever.  Respiratory:  Negative for chest tightness and shortness of breath.   Cardiovascular:  Negative for chest pain and palpitations.  Neurological:  Negative for  dizziness, light-headedness and headaches.      Objective:    Physical Exam Constitutional:      Appearance: He is obese.  HENT:     Head: Normocephalic.  Cardiovascular:     Rate and Rhythm: Normal rate and regular rhythm.     Pulses: Normal pulses.     Heart sounds: Normal heart sounds.  Pulmonary:     Effort: Pulmonary effort is normal.     Breath sounds: Normal breath sounds.     BP 124/72 (BP Location: Left Arm)   Pulse 77   Ht 5' 6.5" (1.689 m)   Wt 280 lb (127 kg)   SpO2 97%   BMI 44.52 kg/m  Wt Readings from Last 3 Encounters:  02/11/22 280 lb (127 kg)  01/07/22 280 lb (127 kg)  12/07/21 285 lb 1.6 oz (129.3 kg)    Lab Results  Component Value Date   TSH 0.751 12/07/2021   Lab Results  Component Value Date   WBC 9.1 12/07/2021   HGB 15.4 12/07/2021   HCT 46.0 12/07/2021   MCV 86 12/07/2021   PLT 200 12/07/2021   Lab Results  Component Value Date   NA 141 12/07/2021   K 4.4 12/07/2021   CO2 26 12/07/2021   GLUCOSE 86 12/07/2021   BUN 20 12/07/2021   CREATININE 1.20 12/07/2021   BILITOT 0.5 12/07/2021   ALKPHOS 82 12/07/2021   AST 19 12/07/2021   ALT 25 12/07/2021   PROT 6.6 12/07/2021   ALBUMIN 4.2 12/07/2021   CALCIUM 9.1 12/07/2021   EGFR 71 12/07/2021   Lab Results  Component Value Date   CHOL 141 12/07/2021   Lab Results  Component Value Date   HDL 45 12/07/2021   Lab Results  Component Value Date   LDLCALC 84 12/07/2021   Lab Results  Component Value Date   TRIG 55 12/07/2021   Lab Results  Component Value Date   CHOLHDL 3.1 12/07/2021   Lab Results  Component Value Date   HGBA1C 5.8 (H) 12/07/2021      Assessment & Plan:   Problem List Items Addressed This Visit       Cardiovascular and Mediastinum   Essential hypertension - Primary    Controlled Encouraged to continue taking olmesartan-amlodipine-HCTZ 40 - 10 - 25 mg daily No changes to the treatment regimen today Encourage heart-healthy diet and increase  physical activities BP Readings from Last 3 Encounters:  02/11/22 124/72  01/07/22 (!) 146/82  12/07/21 (!) 144/84        Relevant Medications   Olmesartan-amLODIPine-HCTZ 40-10-25 MG TABS     Other  OBESITY    Encourage patient to decrease his intake of fatty foods and carbonated beverages like sodas Encourage increased physical activity at least 3 days a week for 30 minutes Wt Readings from Last 3 Encounters:  02/11/22 280 lb (127 kg)  01/07/22 280 lb (127 kg)  12/07/21 285 lb 1.6 oz (129.3 kg)        Vitamin D deficiency   Relevant Medications   Vitamin D, Ergocalciferol, (DRISDOL) 1.25 MG (50000 UNIT) CAPS capsule   Immunization due   Relevant Orders   Varicella-zoster vaccine IM (Completed)   Need for immunization against influenza    Patient educated on CDC recommendation for the vaccine. Verbal consent was obtained from the patient, vaccine administered by nurse, no sign of adverse reactions noted at this time. Patient education on arm soreness and use of tylenol or ibuprofen for this patient  was discussed. Patient educated on the signs and symptoms of adverse effect and advise to contact the office if they occur.       Other Visit Diagnoses     Flu vaccine need       Relevant Orders   Flu Vaccine QUAD 6+ mos PF IM (Fluarix Quad PF) (Completed)   Cough in adult       Relevant Medications   omeprazole (PRILOSEC) 20 MG capsule   Gastroesophageal reflux disease without esophagitis       Relevant Medications   omeprazole (PRILOSEC) 20 MG capsule       Meds ordered this encounter  Medications   Olmesartan-amLODIPine-HCTZ 40-10-25 MG TABS    Sig: Take 1 tablet by mouth daily.    Dispense:  90 tablet    Refill:  1   omeprazole (PRILOSEC) 20 MG capsule    Sig: Take 1 capsule (20 mg total) by mouth daily.    Dispense:  90 capsule    Refill:  1   Vitamin D, Ergocalciferol, (DRISDOL) 1.25 MG (50000 UNIT) CAPS capsule    Sig: Take 1 capsule (50,000 Units  total) by mouth every 7 (seven) days.    Dispense:  5 capsule    Refill:  2    Follow-up: Return in about 3 months (around 05/14/2022).    Alvira Monday, FNP

## 2022-02-11 NOTE — Assessment & Plan Note (Signed)
Controlled Encouraged to continue taking olmesartan-amlodipine-HCTZ 40 - 10 - 25 mg daily No changes to the treatment regimen today Encourage heart-healthy diet and increase physical activities BP Readings from Last 3 Encounters:  02/11/22 124/72  01/07/22 (!) 146/82  12/07/21 (!) 144/84

## 2022-02-11 NOTE — Assessment & Plan Note (Signed)
Encourage patient to decrease his intake of fatty foods and carbonated beverages like sodas Encourage increased physical activity at least 3 days a week for 30 minutes Wt Readings from Last 3 Encounters:  02/11/22 280 lb (127 kg)  01/07/22 280 lb (127 kg)  12/07/21 285 lb 1.6 oz (129.3 kg)

## 2022-05-18 ENCOUNTER — Ambulatory Visit (INDEPENDENT_AMBULATORY_CARE_PROVIDER_SITE_OTHER): Payer: 59 | Admitting: Family Medicine

## 2022-05-18 ENCOUNTER — Encounter: Payer: Self-pay | Admitting: Family Medicine

## 2022-05-18 VITALS — BP 134/78 | HR 79 | Ht 66.0 in | Wt 280.0 lb

## 2022-05-18 DIAGNOSIS — R7303 Prediabetes: Secondary | ICD-10-CM | POA: Diagnosis not present

## 2022-05-18 DIAGNOSIS — I10 Essential (primary) hypertension: Secondary | ICD-10-CM

## 2022-05-18 DIAGNOSIS — E559 Vitamin D deficiency, unspecified: Secondary | ICD-10-CM

## 2022-05-18 DIAGNOSIS — Z1329 Encounter for screening for other suspected endocrine disorder: Secondary | ICD-10-CM

## 2022-05-18 DIAGNOSIS — Z6841 Body Mass Index (BMI) 40.0 and over, adult: Secondary | ICD-10-CM

## 2022-05-18 MED ORDER — VITAMIN D (ERGOCALCIFEROL) 1.25 MG (50000 UNIT) PO CAPS: 50000.0000 [IU] | ORAL_CAPSULE | ORAL | 2 refills | Status: DC

## 2022-05-18 MED ORDER — OLMESARTAN-AMLODIPINE-HCTZ 40-10-25 MG PO TABS: 1.0000 | ORAL_TABLET | Freq: Every day | ORAL | 1 refills | Status: DC

## 2022-05-18 NOTE — Progress Notes (Signed)
Established Patient Office Visit  Subjective:  Patient ID: Christopher Thomas, male    DOB: [birth year]  Age: 57 y.o. MRN: [id number]  CC:  Chief Complaint  Patient presents with   Follow-up    3 month f/u, pt states he has been consistent with medications, no other complaints.     HPI Christopher Thomas is a 57 y.o. male with past medical history of essential hypertension, morbid obesity, erectile dysfunction, and obesity presents for f/u of  chronic medical conditions. For the details of today's visit, please refer to the assessment and plan.     Past Medical History:  Diagnosis Date   History of kidney stones    Hypertension    Kidney stones    Kidney stones 11/05/2016    Past Surgical History:  Procedure Laterality Date   APPENDECTOMY     CHOLECYSTECTOMY     COLONOSCOPY WITH PROPOFOL N/A 10/28/2020   Procedure: COLONOSCOPY WITH PROPOFOL;  Surgeon: Eloise Harman, DO;  Location: [facility];  Service: Endoscopy;  Laterality: N/A;  9:00am   lithrotripsy     POLYPECTOMY  10/28/2020   Procedure: POLYPECTOMY INTESTINAL;  Surgeon: Eloise Harman, DO;  Location: [facility];  Service: Endoscopy;;    Family History  Problem Relation Age of Onset   COPD Mother    Hypertension Mother    Alcohol abuse Mother    Asthma Mother    Heart disease Mother        died at 40   Hypertension Father    Stroke Sister    Alcohol abuse Sister    Cancer Brother    Early death Brother        homicide   Colon cancer Paternal Grandmother        in her 33s    Social History   Socioeconomic History   Marital status: Significant Other    Spouse name: Pam   Number of children: 1   Years of education: 12   Highest education level: Not on file  Occupational History   Occupation: Engineer, building services    Comment: [employer]  Tobacco Use   Smoking status: Never   Smokeless tobacco: Never  Vaping Use   Vaping Use: Never used  Substance and Sexual Activity   Alcohol use: Yes     Comment: occasionally- maybe one drink per week   Drug use: No   Sexual activity: Yes    Birth control/protection: None  Other Topics Concern   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 16 g 6   omeprazole (PRILOSEC) 20 MG capsule Take 1 capsule (20 mg total) by mouth daily. 90 capsule 1   tamsulosin (FLOMAX) 0.4 MG CAPS capsule Take 1 capsule (0.4 mg total) by mouth daily. 90 capsule 1   Olmesartan-amLODIPine-HCTZ 40-10-25 MG TABS Take 1 tablet by mouth daily. 90 tablet 1   Vitamin  D, Ergocalciferol, (DRISDOL) 1.25 MG (50000 UNIT) CAPS capsule Take 1 capsule (50,000 Units total) by mouth every 7 (seven) days. 5 capsule 2   No facility-administered medications prior to visit.    Allergies  Allergen Reactions   Lisinopril Cough    ROS Review of Systems  Constitutional:  Negative for fatigue and fever.  Eyes:  Negative for visual disturbance.  Respiratory:  Negative for chest tightness and shortness of breath.   Cardiovascular:  Negative for chest pain and palpitations.  Neurological:  Negative for dizziness and headaches.      Objective:    Physical Exam HENT:     Head: Normocephalic.     Right Ear: External ear normal.     Left Ear: External ear normal.     Nose: No congestion or rhinorrhea.     Mouth/Throat:     Mouth: Mucous membranes are moist.  Cardiovascular:     Rate and Rhythm: Regular rhythm.     Heart sounds: No  murmur heard. Pulmonary:     Effort: No respiratory distress.     Breath sounds: Normal breath sounds.  Neurological:     Mental Status: He is alert.     BP 134/78   Pulse 79   Ht '5\' 6"'$  (1.676 m)   Wt 280 lb (127 kg)   SpO2 94%   BMI 45.19 kg/m  Wt Readings from Last 3 Encounters:  05/18/22 280 lb (127 kg)  02/11/22 280 lb (127 kg)  01/07/22 280 lb (127 kg)    Lab Results  Component Value Date   TSH 0.751 12/07/2021   Lab Results  Component Value Date   WBC 9.1 12/07/2021   HGB 15.4 12/07/2021   HCT 46.0 12/07/2021   MCV 86 12/07/2021   PLT 200 12/07/2021   Lab Results  Component Value Date   NA 141 12/07/2021   K 4.4 12/07/2021   CO2 26 12/07/2021   GLUCOSE 86 12/07/2021   BUN 20 12/07/2021   CREATININE 1.20 12/07/2021   BILITOT 0.5 12/07/2021   ALKPHOS 82 12/07/2021   AST 19 12/07/2021   ALT 25 12/07/2021   PROT 6.6 12/07/2021   ALBUMIN 4.2 12/07/2021   CALCIUM 9.1 12/07/2021   EGFR 71 12/07/2021   Lab Results  Component Value Date   CHOL 141 12/07/2021   Lab Results  Component Value Date   HDL 45 12/07/2021   Lab Results  Component Value Date   LDLCALC 84 12/07/2021   Lab Results  Component Value Date   TRIG 55 12/07/2021   Lab Results  Component Value Date   CHOLHDL 3.1 12/07/2021   Lab Results  Component Value Date   HGBA1C 5.8 (H) 12/07/2021      Assessment & Plan:  Essential hypertension Assessment & Plan: Controlled Encouraged to continue taking olmesartan-amlodipine-HCTZ 40 - 10 - 25 mg daily No changes to the treatment regimen today Encourage heart-healthy diet and increase physical activities BP Readings from Last 3 Encounters:  05/18/22 134/78  02/11/22 124/72  01/07/22 (!) 146/82    Orders: -     Olmesartan-amLODIPine-HCTZ; Take 1 tablet by mouth daily.  Dispense: 90 tablet; Refill: 1 -     Lipid panel -     CMP14+EGFR  Vitamin D deficiency Assessment & Plan: Labs ordered  Orders: -     VITAMIN D 25  Hydroxy (Vit-D Deficiency, Fractures) -     Vitamin D (Ergocalciferol); Take 1 capsule (50,000 Units total) by mouth every 7 (seven)  days.  Dispense: 10 capsule; Refill: 2  Class 3 severe obesity due to excess calories with serious comorbidity and body mass index (BMI) of 40.0 to 44.9 in adult (HCC)  Prediabetes -     Hemoglobin A1c -     CBC with Differential/Platelet  Screening for thyroid disorder -     TSH + free T4    Follow-up: Return in about 3 months (around 08/17/2022).   Alvira Monday, FNP

## 2022-05-18 NOTE — Patient Instructions (Signed)
I appreciate the opportunity to provide care to you today!    Follow up:  3 months  Labs: please stop by the lab today to get your blood drawn (CBC, CMP, TSH, Lipid profile, HgA1c, Vit D)    Please continue to a heart-healthy diet and increase your physical activities. Try to exercise for 76mns at least five times a week.      It was a pleasure to see you and I look forward to continuing to work together on your health and well-being. Please do not hesitate to call the office if you need care or have questions about your care.   Have a wonderful day and week. With Gratitude, GAlvira MondayMSN, FNP-BC

## 2022-05-18 NOTE — Assessment & Plan Note (Signed)
Labs ordered.

## 2022-05-18 NOTE — Assessment & Plan Note (Signed)
Controlled Encouraged to continue taking olmesartan-amlodipine-HCTZ 40 - 10 - 25 mg daily No changes to the treatment regimen today Encourage heart-healthy diet and increase physical activities BP Readings from Last 3 Encounters:  05/18/22 134/78  02/11/22 124/72  01/07/22 (!) 146/82

## 2022-05-19 LAB — CBC WITH DIFFERENTIAL/PLATELET
Basophils Absolute: 0.1 10*3/uL (ref 0.0–0.2)
Basos: 1 %
EOS (ABSOLUTE): 0.2 10*3/uL (ref 0.0–0.4)
Eos: 3 %
Hematocrit: 51 % (ref 37.5–51.0)
Hemoglobin: 16.8 g/dL (ref 13.0–17.7)
Immature Grans (Abs): 0 10*3/uL (ref 0.0–0.1)
Immature Granulocytes: 0 %
Lymphocytes Absolute: 3.4 10*3/uL — ABNORMAL HIGH (ref 0.7–3.1)
Lymphs: 37 %
MCH: 29.2 pg (ref 26.6–33.0)
MCHC: 32.9 g/dL (ref 31.5–35.7)
MCV: 89 fL (ref 79–97)
Monocytes Absolute: 0.7 10*3/uL (ref 0.1–0.9)
Monocytes: 8 %
Neutrophils Absolute: 4.9 10*3/uL (ref 1.4–7.0)
Neutrophils: 51 %
Platelets: 199 10*3/uL (ref 150–450)
RBC: 5.75 x10E6/uL (ref 4.14–5.80)
RDW: 13.2 % (ref 11.6–15.4)
WBC: 9.4 10*3/uL (ref 3.4–10.8)

## 2022-05-19 LAB — CMP14+EGFR
ALT: 27 IU/L (ref 0–44)
AST: 20 IU/L (ref 0–40)
Albumin/Globulin Ratio: 1.7 (ref 1.2–2.2)
Albumin: 4.6 g/dL (ref 3.8–4.9)
Alkaline Phosphatase: 84 IU/L (ref 44–121)
BUN/Creatinine Ratio: 19 (ref 9–20)
BUN: 24 mg/dL (ref 6–24)
Bilirubin Total: 0.5 mg/dL (ref 0.0–1.2)
CO2: 27 mmol/L (ref 20–29)
Calcium: 9.8 mg/dL (ref 8.7–10.2)
Chloride: 96 mmol/L (ref 96–106)
Creatinine, Ser: 1.27 mg/dL (ref 0.76–1.27)
Globulin, Total: 2.7 g/dL (ref 1.5–4.5)
Glucose: 86 mg/dL (ref 70–99)
Potassium: 3.8 mmol/L (ref 3.5–5.2)
Sodium: 140 mmol/L (ref 134–144)
Total Protein: 7.3 g/dL (ref 6.0–8.5)
eGFR: 66 mL/min/{1.73_m2} (ref >59)

## 2022-05-19 LAB — HEMOGLOBIN A1C
Est. average glucose Bld gHb Est-mCnc: 123 mg/dL
Hgb A1c MFr Bld: 5.9 % — ABNORMAL HIGH (ref 4.8–5.6)

## 2022-05-19 LAB — TSH+FREE T4
Free T4: 1.37 ng/dL (ref 0.82–1.77)
TSH: 1.03 u[IU]/mL (ref 0.450–4.500)

## 2022-05-19 LAB — LIPID PANEL
Chol/HDL Ratio: 3.3 ratio (ref 0.0–5.0)
Cholesterol, Total: 158 mg/dL (ref 100–199)
HDL: 48 mg/dL (ref >39)
LDL Chol Calc (NIH): 97 mg/dL (ref 0–99)
Triglycerides: 64 mg/dL (ref 0–149)
VLDL Cholesterol Cal: 13 mg/dL (ref 5–40)

## 2022-05-19 LAB — VITAMIN D 25 HYDROXY (VIT D DEFICIENCY, FRACTURES): Vit D, 25-Hydroxy: 20.7 ng/mL — ABNORMAL LOW (ref 30.0–100.0)

## 2022-06-11 ENCOUNTER — Other Ambulatory Visit: Payer: Self-pay | Admitting: Family Medicine

## 2022-06-11 DIAGNOSIS — E559 Vitamin D deficiency, unspecified: Secondary | ICD-10-CM

## 2022-06-11 MED ORDER — VITAMIN D (ERGOCALCIFEROL) 1.25 MG (50000 UNIT) PO CAPS: 50000.0000 [IU] | ORAL_CAPSULE | ORAL | 2 refills | Status: DC

## 2022-06-11 NOTE — Progress Notes (Signed)
A weekly vitamin D supplement prescription has been sent to your pharmacy because your vitamin D is low. Your labs indicate that you have prediabetes.  I recommend avoiding simple carbohydrates including cakes, sweet desserts, ice cream, soda (diet or regular), sweet tea, candies, chips, cookies, store-bought juices, alcohol in excess of 1-2 drinks a day, lemonade, artificial sweeteners, donuts, coffee creamers, and sugar-free products.  I recommend avoiding greasy, fatty foods with increased physical activity. Your thyroid, kidneys, and liver function are stable.

## 2022-08-17 ENCOUNTER — Encounter: Payer: Self-pay | Admitting: Family Medicine

## 2022-08-17 ENCOUNTER — Ambulatory Visit (INDEPENDENT_AMBULATORY_CARE_PROVIDER_SITE_OTHER): Payer: 59 | Admitting: Family Medicine

## 2022-08-17 VITALS — BP 132/80 | HR 83 | Ht 67.0 in | Wt 281.0 lb

## 2022-08-17 DIAGNOSIS — E785 Hyperlipidemia, unspecified: Secondary | ICD-10-CM

## 2022-08-17 DIAGNOSIS — R3911 Hesitancy of micturition: Secondary | ICD-10-CM | POA: Diagnosis not present

## 2022-08-17 DIAGNOSIS — K219 Gastro-esophageal reflux disease without esophagitis: Secondary | ICD-10-CM

## 2022-08-17 DIAGNOSIS — R7301 Impaired fasting glucose: Secondary | ICD-10-CM

## 2022-08-17 DIAGNOSIS — I10 Essential (primary) hypertension: Secondary | ICD-10-CM

## 2022-08-17 DIAGNOSIS — E559 Vitamin D deficiency, unspecified: Secondary | ICD-10-CM

## 2022-08-17 DIAGNOSIS — E7849 Other hyperlipidemia: Secondary | ICD-10-CM

## 2022-08-17 DIAGNOSIS — R339 Retention of urine, unspecified: Secondary | ICD-10-CM

## 2022-08-17 DIAGNOSIS — E038 Other specified hypothyroidism: Secondary | ICD-10-CM

## 2022-08-17 HISTORY — DX: Gastro-esophageal reflux disease without esophagitis: K21.9

## 2022-08-17 HISTORY — DX: Hyperlipidemia, unspecified: E78.5

## 2022-08-17 MED ORDER — TAMSULOSIN HCL 0.4 MG PO CAPS: 0.4000 mg | ORAL_CAPSULE | Freq: Every day | ORAL | 1 refills | Status: DC

## 2022-08-17 NOTE — Assessment & Plan Note (Signed)
Stable on Flomax 0.4 mg daily Refill sent to the pharmacy

## 2022-08-17 NOTE — Assessment & Plan Note (Signed)
Controlled Encouraged to continue taking olmesartan-amlodipine-HCTZ 40 - 10 - 25 mg daily No changes to the treatment regimen today Encourage heart-healthy diet and increase physical activities BP Readings from Last 3 Encounters:  08/17/22 132/80  05/18/22 134/78  02/11/22 124/72

## 2022-08-17 NOTE — Patient Instructions (Signed)
I appreciate the opportunity to provide care to you today!    Follow up:  3 months  Labs: please stop by the lab today to get your blood drawn (CBC, CMP, TSH, Lipid profile, HgA1c, Vit D)     Please continue to a heart-healthy diet and increase your physical activities. Try to exercise for 30mins at least five days a week.      It was a pleasure to see you and I look forward to continuing to work together on your health and well-being. Please do not hesitate to call the office if you need care or have questions about your care.   Have a wonderful day and week. With Gratitude, Herson Prichard MSN, FNP-BC  

## 2022-08-17 NOTE — Progress Notes (Signed)
Established Patient Office Visit  Subjective:  Patient ID: Christopher Thomas, male    DOB: 08/23/1965  Age: 57 y.o. MRN: [id number]  CC:  Chief Complaint  Patient presents with   Follow-up    3 month f/u    HPI Christopher Thomas is a 57 y.o. male with past medical history of hypertension, GERD, vitamin D deficiency, and urinary urgency presents for f/u of  chronic medical conditions. For the details of today's visit, please refer to the assessment and plan.     Past Medical History:  Diagnosis Date   GERD (gastroesophageal reflux disease) 08/17/2022   History of kidney stones    Hyperlipidemia LDL goal <100 08/17/2022   Hypertension    Kidney stones    Kidney stones 11/05/2016    Past Surgical History:  Procedure Laterality Date   APPENDECTOMY     CHOLECYSTECTOMY     COLONOSCOPY WITH PROPOFOL N/A 10/28/2020   Procedure: COLONOSCOPY WITH PROPOFOL;  Surgeon: Lanelle Bal, DO;  Location: [facility];  Service: Endoscopy;  Laterality: N/A;  9:00am   lithrotripsy     POLYPECTOMY  10/28/2020   Procedure: POLYPECTOMY INTESTINAL;  Surgeon: Lanelle Bal, DO;  Location: [facility];  Service: Endoscopy;;    Family History  Problem Relation Age of Onset   COPD Mother    Hypertension Mother    Alcohol abuse Mother    Asthma Mother    Heart disease Mother        died at 83   Hypertension Father    Stroke Sister    Alcohol abuse Sister    Cancer Brother    Early death Brother        homicide   Colon cancer Paternal Grandmother        in her 81s    Social History   Socioeconomic History   Marital status: Significant Other    Spouse name: Pam   Number of children: 1   Years of education: 12   Highest education level: Not on file  Occupational History   Occupation: Games developer    Comment: [employer]  Tobacco Use   Smoking status: Never   Smokeless tobacco: Never  Vaping Use   Vaping Use: Never used  Substance and Sexual Activity   Alcohol use:  Yes    Comment: occasionally- maybe one drink per week   Drug use: No   Sexual activity: Yes    Birth control/protection: None  Other Topics Concern   Not on file  Social History Narrative   Lives alone   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 16 g 6   Olmesartan-amLODIPine-HCTZ 40-10-25 MG TABS Take 1 tablet by mouth daily. 90 tablet 1   omeprazole (PRILOSEC) 20 MG capsule Take 1 capsule (20 mg total) by mouth daily. 90 capsule 1   Vitamin D, Ergocalciferol, (DRISDOL) 1.25 MG (50000 UNIT) CAPS capsule Take 1 capsule (50,000 Units total) by mouth every  7 (seven) days. 10 capsule 2   tamsulosin (FLOMAX) 0.4 MG CAPS capsule Take 1 capsule (0.4 mg total) by mouth daily. 90 capsule 1   No facility-administered medications prior to visit.    Allergies  Allergen Reactions   Lisinopril Cough    ROS Review of Systems  Constitutional:  Negative for fatigue and fever.  Eyes:  Negative for visual disturbance.  Respiratory:  Negative for chest tightness and shortness of breath.   Cardiovascular:  Negative for chest pain and palpitations.  Neurological:  Negative for dizziness and headaches.      Objective:    Physical Exam HENT:     Head: Normocephalic.     Right Ear: External ear normal.     Left Ear: External ear normal.     Nose: No congestion or rhinorrhea.     Mouth/Throat:     Mouth: Mucous membranes are moist.  Cardiovascular:     Rate and Rhythm: Regular rhythm.     Heart  sounds: No murmur heard. Pulmonary:     Effort: No respiratory distress.     Breath sounds: Normal breath sounds.  Neurological:     Mental Status: He is alert.     BP 132/80   Pulse 83   Ht 5\' 7"  (1.702 m)   Wt 281 lb 0.6 oz (127.5 kg)   SpO2 96%   BMI 44.02 kg/m  Wt Readings from Last 3 Encounters:  08/17/22 281 lb 0.6 oz (127.5 kg)  05/18/22 280 lb (127 kg)  02/11/22 280 lb (127 kg)    Lab Results  Component Value Date   TSH 1.030 05/18/2022   Lab Results  Component Value Date   WBC 9.4 05/18/2022   HGB 16.8 05/18/2022   HCT 51.0 05/18/2022   MCV 89 05/18/2022   PLT 199 05/18/2022   Lab Results  Component Value Date   NA 140 05/18/2022   K 3.8 05/18/2022   CO2 27 05/18/2022   GLUCOSE 86 05/18/2022   BUN 24 05/18/2022   CREATININE 1.27 05/18/2022   BILITOT 0.5 05/18/2022   ALKPHOS 84 05/18/2022   AST 20 05/18/2022   ALT 27 05/18/2022   PROT 7.3 05/18/2022   ALBUMIN 4.6 05/18/2022   CALCIUM 9.8 05/18/2022   EGFR 66 05/18/2022   Lab Results  Component Value Date   CHOL 158 05/18/2022   Lab Results  Component Value Date   HDL 48 05/18/2022   Lab Results  Component Value Date   LDLCALC 97 05/18/2022   Lab Results  Component Value Date   TRIG 64 05/18/2022   Lab Results  Component Value Date   CHOLHDL 3.3 05/18/2022   Lab Results  Component Value Date   HGBA1C 5.9 (H) 05/18/2022      Assessment & Plan:  Essential hypertension Assessment & Plan: Controlled Encouraged to continue taking olmesartan-amlodipine-HCTZ 40 - 10 - 25 mg daily No changes to the treatment regimen today Encourage heart-healthy diet and increase physical activities BP Readings from Last 3 Encounters:  08/17/22 132/80  05/18/22 134/78  02/11/22 124/72     Urinary hesitancy Assessment & Plan: Stable on Flomax 0.4 mg daily Refill sent to the pharmacy  Orders: -     Tamsulosin HCl; Take 1 capsule (0.4 mg total) by mouth daily.  Dispense: 90 capsule; Refill:  1  Gastroesophageal reflux disease without esophagitis Assessment & Plan: No reports of gastric reflux symptoms Only take omeprazole 20 mg as needed   IFG (impaired fasting glucose) -  Hemoglobin A1c  Vitamin D deficiency -     VITAMIN D 25 Hydroxy (Vit-D Deficiency, Fractures)  Other specified hypothyroidism -     TSH + free T4  Other hyperlipidemia -     Lipid panel -     CMP14+EGFR -     CBC with Differential/Platelet  Incomplete emptying of bladder -     Tamsulosin HCl; Take 1 capsule (0.4 mg total) by mouth daily.  Dispense: 90 capsule; Refill: 1    Follow-up: Return in about 3 months (around 11/16/2022).   Gilmore LarocheGloria  [place], FNP

## 2022-08-17 NOTE — Assessment & Plan Note (Signed)
No reports of gastric reflux symptoms Only take omeprazole 20 mg as needed

## 2022-08-18 LAB — CBC WITH DIFFERENTIAL/PLATELET
Basophils Absolute: 0.1 10*3/uL (ref 0.0–0.2)
Basos: 1 %
EOS (ABSOLUTE): 0.2 10*3/uL (ref 0.0–0.4)
Eos: 2 %
Hematocrit: 47.1 % (ref 37.5–51.0)
Hemoglobin: 15.6 g/dL (ref 13.0–17.7)
Immature Grans (Abs): 0 10*3/uL (ref 0.0–0.1)
Immature Granulocytes: 0 %
Lymphocytes Absolute: 2.7 10*3/uL (ref 0.7–3.1)
Lymphs: 34 %
MCH: 29 pg (ref 26.6–33.0)
MCHC: 33.1 g/dL (ref 31.5–35.7)
MCV: 88 fL (ref 79–97)
Monocytes Absolute: 0.7 10*3/uL (ref 0.1–0.9)
Monocytes: 8 %
Neutrophils Absolute: 4.5 10*3/uL (ref 1.4–7.0)
Neutrophils: 55 %
Platelets: 195 10*3/uL (ref 150–450)
RBC: 5.38 x10E6/uL (ref 4.14–5.80)
RDW: 13.2 % (ref 11.6–15.4)
WBC: 8.1 10*3/uL (ref 3.4–10.8)

## 2022-08-18 LAB — VITAMIN D 25 HYDROXY (VIT D DEFICIENCY, FRACTURES): Vit D, 25-Hydroxy: 24.3 ng/mL — ABNORMAL LOW (ref 30.0–100.0)

## 2022-08-18 LAB — CMP14+EGFR
ALT: 25 IU/L (ref 0–44)
AST: 19 IU/L (ref 0–40)
Albumin/Globulin Ratio: 1.5 (ref 1.2–2.2)
Albumin: 4.1 g/dL (ref 3.8–4.9)
Alkaline Phosphatase: 80 IU/L (ref 44–121)
BUN/Creatinine Ratio: 14 (ref 9–20)
BUN: 15 mg/dL (ref 6–24)
Bilirubin Total: 0.5 mg/dL (ref 0.0–1.2)
CO2: 27 mmol/L (ref 20–29)
Calcium: 9.1 mg/dL (ref 8.7–10.2)
Chloride: 100 mmol/L (ref 96–106)
Creatinine, Ser: 1.1 mg/dL (ref 0.76–1.27)
Globulin, Total: 2.7 g/dL (ref 1.5–4.5)
Glucose: 89 mg/dL (ref 70–99)
Potassium: 4.2 mmol/L (ref 3.5–5.2)
Sodium: 140 mmol/L (ref 134–144)
Total Protein: 6.8 g/dL (ref 6.0–8.5)
eGFR: 78 mL/min/{1.73_m2} (ref >59)

## 2022-08-18 LAB — TSH+FREE T4
Free T4: 1.3 ng/dL (ref 0.82–1.77)
TSH: 0.602 u[IU]/mL (ref 0.450–4.500)

## 2022-08-18 LAB — LIPID PANEL
Chol/HDL Ratio: 3.2 ratio (ref 0.0–5.0)
Cholesterol, Total: 131 mg/dL (ref 100–199)
HDL: 41 mg/dL (ref >39)
LDL Chol Calc (NIH): 75 mg/dL (ref 0–99)
Triglycerides: 72 mg/dL (ref 0–149)
VLDL Cholesterol Cal: 15 mg/dL (ref 5–40)

## 2022-08-18 LAB — HEMOGLOBIN A1C
Est. average glucose Bld gHb Est-mCnc: 126 mg/dL
Hgb A1c MFr Bld: 6 % — ABNORMAL HIGH (ref 4.8–5.6)

## 2022-08-18 NOTE — Progress Notes (Signed)
Your vitamin D has improved; I recommend you continue taking your weekly supplement. Your hemoglobin A1c is 6.0; this indicates that you are prediabetic. I recommend avoiding simple carbohydrates, including cakes, sweet desserts, ice cream, soda (diet or regular), sweet tea, candies, chips, cookies, store-bought juices, alcohol in excess of 1-2 drinks a day, lemonade, artificial sweeteners, donuts, coffee creamers, and sugar-free products.  I recommend avoiding greasy, fatty foods with increased physical activity.

## 2022-11-16 ENCOUNTER — Ambulatory Visit: Payer: 59 | Admitting: Family Medicine

## 2022-12-14 ENCOUNTER — Encounter: Payer: Self-pay | Admitting: Family Medicine

## 2022-12-14 ENCOUNTER — Ambulatory Visit (INDEPENDENT_AMBULATORY_CARE_PROVIDER_SITE_OTHER): Payer: 59 | Admitting: Family Medicine

## 2022-12-14 VITALS — BP 135/64 | HR 87 | Ht 66.5 in | Wt 274.1 lb

## 2022-12-14 DIAGNOSIS — E038 Other specified hypothyroidism: Secondary | ICD-10-CM

## 2022-12-14 DIAGNOSIS — R7301 Impaired fasting glucose: Secondary | ICD-10-CM | POA: Diagnosis not present

## 2022-12-14 DIAGNOSIS — E7849 Other hyperlipidemia: Secondary | ICD-10-CM

## 2022-12-14 DIAGNOSIS — E559 Vitamin D deficiency, unspecified: Secondary | ICD-10-CM

## 2022-12-14 DIAGNOSIS — I10 Essential (primary) hypertension: Secondary | ICD-10-CM | POA: Diagnosis not present

## 2022-12-14 DIAGNOSIS — K219 Gastro-esophageal reflux disease without esophagitis: Secondary | ICD-10-CM | POA: Diagnosis not present

## 2022-12-14 DIAGNOSIS — R7303 Prediabetes: Secondary | ICD-10-CM

## 2022-12-14 MED ORDER — OLMESARTAN-AMLODIPINE-HCTZ 40-10-25 MG PO TABS
1.0000 | ORAL_TABLET | Freq: Every day | ORAL | Status: DC
Start: 2022-12-14 — End: 2023-08-03

## 2022-12-14 NOTE — Progress Notes (Signed)
Established Patient Office Visit  Subjective:  Patient ID: Christopher Thomas, male    DOB: [birth year]  Age: 57 y.o. MRN: [id number]  CC:  Chief Complaint  Patient presents with   Care Management    3 month f/u    HPI Christopher Thomas is a 57 y.o. male with past medical history of hypertension, GERD, vitamin D deficiency presents for f/u of  chronic medical conditions. For the details of today's visit, please refer to the assessment and plan.     Past Medical History:  Diagnosis Date   GERD (gastroesophageal reflux disease) 08/17/2022   History of kidney stones    Hyperlipidemia LDL goal <100 08/17/2022   Hypertension    Kidney stones    Kidney stones 11/05/2016    Past Surgical History:  Procedure Laterality Date   APPENDECTOMY     CHOLECYSTECTOMY     COLONOSCOPY WITH PROPOFOL N/A 10/28/2020   Procedure: COLONOSCOPY WITH PROPOFOL;  Surgeon: Lanelle Bal, DO;  [facility]: [facility];  Service: Endoscopy;  Laterality: N/A;  9:00am   lithrotripsy     POLYPECTOMY  10/28/2020   Procedure: POLYPECTOMY INTESTINAL;  Surgeon: Lanelle Bal, DO;  Location: [facility];  Service: Endoscopy;;    Family History  Problem Relation Age of Onset   COPD Mother    Hypertension Mother    Alcohol abuse Mother    Asthma Mother    Heart disease Mother        died at 53   Hypertension Father    Stroke Sister    Alcohol abuse Sister    Cancer Brother    Early death Brother        homicide   Colon cancer Paternal Grandmother        in her 68s    Social History   Socioeconomic History   Marital status: Significant Other    Spouse name: Pam   Number of children: 1   Years of education: 12   Highest education level: Not on file  Occupational History   Occupation: Games developer    Comment: [employer]  Tobacco Use   Smoking status: Never   Smokeless tobacco: Never  Vaping Use   Vaping status: Never Used  Substance and Sexual Activity   Alcohol use: Yes     Comment: occasionally- maybe one drink per week   Drug use: No   Sexual activity: Yes    Birth control/protection: None  Other Topics Concern   Not on file  Social History Narrative   Lives alone   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 16 g 6   omeprazole (PRILOSEC) 20 MG capsule Take 1 capsule (20 mg total) by mouth daily. 90 capsule 1   tamsulosin (FLOMAX) 0.4 MG CAPS capsule Take 1 capsule (0.4 mg total) by mouth daily. 90 capsule 1   Vitamin D, Ergocalciferol, (DRISDOL) 1.25 MG (50000 UNIT) CAPS capsule Take 1 capsule (50,000 Units total)  by mouth every 7 (seven) days. 10 capsule 2   Olmesartan-amLODIPine-HCTZ 40-10-25 MG TABS Take 1 tablet by mouth daily. 90 tablet 1   No facility-administered medications prior to visit.    Allergies  Allergen Reactions   Lisinopril Cough    ROS Review of Systems  Constitutional:  Negative for fatigue and fever.  Eyes:  Negative for visual disturbance.  Respiratory:  Negative for chest tightness and shortness of breath.   Cardiovascular:  Negative for chest pain and palpitations.  Neurological:  Negative for dizziness and headaches.      Objective:    Physical Exam HENT:     Head: Normocephalic.     Right Ear: External ear normal.     Left Ear: External ear normal.     Nose: No congestion or rhinorrhea.     Mouth/Throat:     Mouth: Mucous membranes are moist.  Cardiovascular:     Rate and Rhythm: Regular rhythm.     Heart sounds: No  murmur heard. Pulmonary:     Effort: No respiratory distress.     Breath sounds: Normal breath sounds.  Neurological:     Mental Status: He is alert.     BP 135/64   Pulse 87   Ht 5' 6.5" (1.689 m)   Wt 274 lb 1.9 oz (124.3 kg)   SpO2 94%   BMI 43.58 kg/m  Wt Readings from Last 3 Encounters:  12/14/22 274 lb 1.9 oz (124.3 kg)  08/17/22 281 lb 0.6 oz (127.5 kg)  05/18/22 280 lb (127 kg)    Lab Results  Component Value Date   TSH 0.602 08/17/2022   Lab Results  Component Value Date   WBC 8.1 08/17/2022   HGB 15.6 08/17/2022   HCT 47.1 08/17/2022   MCV 88 08/17/2022   PLT 195 08/17/2022   Lab Results  Component Value Date   NA 140 08/17/2022   K 4.2 08/17/2022   CO2 27 08/17/2022   GLUCOSE 89 08/17/2022   BUN 15 08/17/2022   CREATININE 1.10 08/17/2022   BILITOT 0.5 08/17/2022   ALKPHOS 80 08/17/2022   AST 19 08/17/2022   ALT 25 08/17/2022   PROT 6.8 08/17/2022   ALBUMIN 4.1 08/17/2022   CALCIUM 9.1 08/17/2022   EGFR 78 08/17/2022   Lab Results  Component Value Date   CHOL 131 08/17/2022   Lab Results  Component Value Date   HDL 41 08/17/2022   Lab Results  Component Value Date   LDLCALC 75 08/17/2022   Lab Results  Component Value Date   TRIG 72 08/17/2022   Lab Results  Component Value Date   CHOLHDL 3.2 08/17/2022   Lab Results  Component Value Date   HGBA1C 6.0 (H) 08/17/2022      Assessment & Plan:  Essential hypertension Assessment & Plan: Controlled Encouraged to continue taking olmesartan-amlodipine-HCTZ 40 - 10 - 25 mg daily No changes to the treatment regimen today Encouraged heart-healthy diet and increase physical activities BP Readings from Last 3 Encounters:  12/14/22 135/64  08/17/22 132/80  05/18/22 134/78    Orders: -     Olmesartan-amLODIPine-HCTZ; Take 1 tablet by mouth daily.  Gastroesophageal reflux disease without esophagitis Assessment & Plan: No reports of gastric reflux symptoms Only take omeprazole  20 mg as needed GERD diet advised   Prediabetes Assessment & Plan:  I recommend making lifestyle changes. This includes avoiding simple carbohydrates such as cakes, sweet desserts, ice cream, soda (diet or regular), sweet tea, candies,  chips, cookies, store-bought juices, excessive alcohol (more than 1-2 drinks per day), lemonade, artificial sweeteners, donuts, coffee creamers, and sugar-free products.  Lab Results  Component Value Date   HGBA1C 6.0 (H) 08/17/2022      IFG (impaired fasting glucose) -     Hemoglobin A1c  Vitamin D deficiency -     VITAMIN D 25 Hydroxy (Vit-D Deficiency, Fractures)  Other specified hypothyroidism -     TSH + free T4  Other hyperlipidemia -     Lipid panel -     CMP14+EGFR -     CBC with Differential/Platelet  Note: This chart has been completed using Engineer, civil (consulting) software, and while attempts have been made to ensure accuracy, certain words and phrases may not be transcribed as intended.    Follow-up: Return in about 4 months (around 04/15/2023).   Gilmore Laroche, FNP

## 2022-12-14 NOTE — Assessment & Plan Note (Addendum)
Controlled Encouraged to continue taking olmesartan-amlodipine-HCTZ 40 - 10 - 25 mg daily No changes to the treatment regimen today Encouraged heart-healthy diet and increase physical activities BP Readings from Last 3 Encounters:  12/14/22 135/64  08/17/22 132/80  05/18/22 134/78

## 2022-12-14 NOTE — Patient Instructions (Addendum)
I appreciate the opportunity to provide care to you today!    Follow up:  4 months  Fasting Labs: please stop by the lab during the to get your blood drawn (CBC, CMP, TSH, Lipid profile, HgA1c, Vit D)   Attached with your AVS, you will find valuable resources for self-education. I highly recommend dedicating some time to thoroughly examine them.   Please continue to a heart-healthy diet and increase your physical activities. Try to exercise for at least five days a week.    It was a pleasure to see you and I look forward to continuing to work together on your health and well-being. Please do not hesitate to call the office if you need care or have questions about your care.  In case of emergency, please visit the Emergency Department for urgent care, or contact our clinic at [phone] to schedule an appointment. We're here to help you!   Have a wonderful day and week. With Gratitude, Gilmore Laroche MSN, FNP-BC

## 2022-12-14 NOTE — Assessment & Plan Note (Signed)
I recommend making lifestyle changes. This includes avoiding simple carbohydrates such as cakes, sweet desserts, ice cream, soda (diet or regular), sweet tea, candies, chips, cookies, store-bought juices, excessive alcohol (more than 1-2 drinks per day), lemonade, artificial sweeteners, donuts, coffee creamers, and sugar-free products.  Lab Results  Component Value Date   HGBA1C 6.0 (H) 08/17/2022

## 2022-12-14 NOTE — Assessment & Plan Note (Signed)
No reports of gastric reflux symptoms Only take omeprazole 20 mg as needed GERD diet advised

## 2022-12-15 ENCOUNTER — Other Ambulatory Visit: Payer: Self-pay | Admitting: Family Medicine

## 2022-12-15 DIAGNOSIS — E559 Vitamin D deficiency, unspecified: Secondary | ICD-10-CM

## 2022-12-15 MED ORDER — VITAMIN D (ERGOCALCIFEROL) 1.25 MG (50000 UNIT) PO CAPS: 50000.0000 [IU] | ORAL_CAPSULE | ORAL | 1 refills | Status: DC

## 2022-12-15 NOTE — Progress Notes (Signed)
  Please inform the patient that his HbA1c level is 6.1, which indicates prediabetes. I recommend reducing his intake of high-sugar foods and beverages, along with increasing his physical activity.  Additionally, his vitamin D levels are low. I have sent a prescription for a weekly vitamin D supplement to his pharmacy. I also recommend increasing his intake of vitamin D-rich foods, such as fatty fish (like salmon, mackerel, and tuna), fortified dairy products, egg yolks, and fortified cereals.  All other labs are stable

## 2023-06-17 ENCOUNTER — Ambulatory Visit: Payer: 59 | Admitting: Family Medicine

## 2023-07-28 ENCOUNTER — Ambulatory Visit: Payer: 59 | Admitting: Family Medicine

## 2023-07-28 ENCOUNTER — Telehealth: Payer: Self-pay | Admitting: Family Medicine

## 2023-07-28 NOTE — Telephone Encounter (Signed)
 Need med refill until next appointment in June.  Olmesartan-amLODIPine-HCTZ 40-10-25 MG TABS [[id number]]    omeprazole (PRILOSEC) 20 MG capsule [[id number]]   tamsulosin (FLOMAX) 0.4 MG CAPS capsule [[id number]]   fluticasone (FLONASE) 50 MCG/ACT nasal spray [[id number]]   Vitamin D, Ergocalciferol, (DRISDOL) 1.25 MG (50000 UNIT) CAPS capsule [[id number]]   Pharmacy: [facility]

## 2023-07-29 NOTE — Telephone Encounter (Signed)
Kindly refill

## 2023-08-03 ENCOUNTER — Other Ambulatory Visit: Payer: Self-pay

## 2023-08-03 DIAGNOSIS — R0981 Nasal congestion: Secondary | ICD-10-CM

## 2023-08-03 DIAGNOSIS — I10 Essential (primary) hypertension: Secondary | ICD-10-CM

## 2023-08-03 DIAGNOSIS — K219 Gastro-esophageal reflux disease without esophagitis: Secondary | ICD-10-CM

## 2023-08-03 DIAGNOSIS — R339 Retention of urine, unspecified: Secondary | ICD-10-CM

## 2023-08-03 DIAGNOSIS — R059 Cough, unspecified: Secondary | ICD-10-CM

## 2023-08-03 DIAGNOSIS — R3911 Hesitancy of micturition: Secondary | ICD-10-CM

## 2023-08-03 DIAGNOSIS — E559 Vitamin D deficiency, unspecified: Secondary | ICD-10-CM

## 2023-08-03 MED ORDER — OMEPRAZOLE 20 MG PO CPDR
20.0000 mg | DELAYED_RELEASE_CAPSULE | Freq: Every day | ORAL | 1 refills | Status: DC
Start: 2023-08-03 — End: 2023-12-01

## 2023-08-03 MED ORDER — FLUTICASONE PROPIONATE 50 MCG/ACT NA SUSP: 2.0000 | Freq: Every day | NASAL | 6 refills | Status: DC

## 2023-08-03 MED ORDER — TAMSULOSIN HCL 0.4 MG PO CAPS
0.4000 mg | ORAL_CAPSULE | Freq: Every day | ORAL | 1 refills | Status: DC
Start: 2023-08-03 — End: 2023-12-01

## 2023-08-03 MED ORDER — OLMESARTAN-AMLODIPINE-HCTZ 40-10-25 MG PO TABS
1.0000 | ORAL_TABLET | Freq: Every day | ORAL | Status: DC
Start: 2023-08-03 — End: 2023-09-27

## 2023-08-03 MED ORDER — VITAMIN D (ERGOCALCIFEROL) 1.25 MG (50000 UNIT) PO CAPS
50000.0000 [IU] | ORAL_CAPSULE | ORAL | 1 refills | Status: DC
Start: 2023-08-03 — End: 2023-12-01

## 2023-08-03 NOTE — Telephone Encounter (Signed)
 Refills sent

## 2023-09-27 ENCOUNTER — Other Ambulatory Visit: Payer: Self-pay | Admitting: Family Medicine

## 2023-09-27 DIAGNOSIS — I10 Essential (primary) hypertension: Secondary | ICD-10-CM

## 2023-09-27 MED ORDER — OLMESARTAN-AMLODIPINE-HCTZ 40-10-25 MG PO TABS: 1.0000 | ORAL_TABLET | Freq: Every day | ORAL | Status: DC

## 2023-09-27 NOTE — Telephone Encounter (Signed)
 Copied from CRM [id number]. Topic: Clinical - Medication Refill >> Sep 27, 2023 11:15 AM Bridgette Campus T wrote: Medication: Olmesartan -amLODIPine -HCTZ 40-10-25 MG TABS  Has the patient contacted their pharmacy? No (Agent: If no, request that the patient contact the pharmacy for the refill. If patient does not wish to contact the pharmacy document the reason why and proceed with request.) (Agent: If yes, when and what did the pharmacy advise?)  This is the patient's preferred pharmacy:  [facility] - [place], [place] - [address] [place] [address] Phone: [phone] Fax: [phone]  Is this the correct pharmacy for this prescription? Yes If no, delete pharmacy and type the correct one.   Has the prescription been filled recently? Yes  Is the patient out of the medication? Yes  Has the patient been seen for an appointment in the last year OR does the patient have an upcoming appointment? Yes  Can we respond through MyChart? Yes  Agent: Please be advised that Rx refills may take up to 3 business days. We ask that you follow-up with your pharmacy.

## 2023-10-13 ENCOUNTER — Ambulatory Visit: Admitting: Family Medicine

## 2023-10-14 ENCOUNTER — Other Ambulatory Visit: Payer: Self-pay

## 2023-10-14 ENCOUNTER — Telehealth: Payer: Self-pay | Admitting: Family Medicine

## 2023-10-14 DIAGNOSIS — I10 Essential (primary) hypertension: Secondary | ICD-10-CM

## 2023-10-14 MED ORDER — OLMESARTAN-AMLODIPINE-HCTZ 40-10-25 MG PO TABS
1.0000 | ORAL_TABLET | Freq: Every day | ORAL | 0 refills | Status: DC
Start: 2023-10-14 — End: 2023-10-17

## 2023-10-14 NOTE — Telephone Encounter (Signed)
 Prescription Request  10/14/2023  LOV: 12/14/2022  What is the name of the medication or equipment? Olmesartan -amLODIPine -HCTZ 40-10-25 MG TABS [[id number]]  Patient COMPLETELY out of med    Have you contacted your pharmacy to request a refill? No   Which pharmacy would you like this sent to?  [facility] - [place], [place] - [address] [place] [address] Phone: [phone] Fax: [phone]    Patient notified that their request is being sent to the clinical staff for review and that they should receive a response within 2 business days.   Please advise at [facility] [phone]

## 2023-10-14 NOTE — Telephone Encounter (Signed)
 Was supposed to return for a f/u in Dec 2024. I will refill 30 days ONLY. Needs appt to continue receiving refills

## 2023-10-14 NOTE — Telephone Encounter (Signed)
 Patient will be added to schedule for 6/9 . Patient is already aware.

## 2023-10-17 ENCOUNTER — Encounter: Payer: Self-pay | Admitting: Family Medicine

## 2023-10-17 ENCOUNTER — Ambulatory Visit (INDEPENDENT_AMBULATORY_CARE_PROVIDER_SITE_OTHER): Admitting: Family Medicine

## 2023-10-17 VITALS — BP 137/81 | HR 89 | Ht 66.5 in | Wt 261.0 lb

## 2023-10-17 DIAGNOSIS — I10 Essential (primary) hypertension: Secondary | ICD-10-CM

## 2023-10-17 DIAGNOSIS — R5383 Other fatigue: Secondary | ICD-10-CM | POA: Insufficient documentation

## 2023-10-17 DIAGNOSIS — E7849 Other hyperlipidemia: Secondary | ICD-10-CM

## 2023-10-17 DIAGNOSIS — R7301 Impaired fasting glucose: Secondary | ICD-10-CM

## 2023-10-17 DIAGNOSIS — E559 Vitamin D deficiency, unspecified: Secondary | ICD-10-CM

## 2023-10-17 DIAGNOSIS — J40 Bronchitis, not specified as acute or chronic: Secondary | ICD-10-CM

## 2023-10-17 DIAGNOSIS — E038 Other specified hypothyroidism: Secondary | ICD-10-CM

## 2023-10-17 MED ORDER — PROMETHAZINE-DM 6.25-15 MG/5ML PO SYRP: 5.0000 mL | ORAL_SOLUTION | Freq: Four times a day (QID) | ORAL | 0 refills | Status: DC | PRN

## 2023-10-17 MED ORDER — PREDNISONE 20 MG PO TABS: 40.0000 mg | ORAL_TABLET | Freq: Every day | ORAL | 0 refills | Status: AC

## 2023-10-17 MED ORDER — OLMESARTAN-AMLODIPINE-HCTZ 40-10-25 MG PO TABS
1.0000 | ORAL_TABLET | Freq: Every day | ORAL | 1 refills | Status: DC
Start: 2023-10-17 — End: 2023-12-01

## 2023-10-17 NOTE — Assessment & Plan Note (Signed)
 Pending labs. The patient is encouraged to maintain adherence to a well-balanced diet, increase physical activity, and practice proper sleep hygiene to support overall health.

## 2023-10-17 NOTE — Progress Notes (Signed)
 Established Patient Office Visit  Subjective:  Patient ID: Christopher Thomas, male    DOB: 10-29-1965  Age: 58 y.o. MRN: [id number]  CC:  Chief Complaint  Patient presents with   Hypertension    Follow up    Fatigue    For two months patient has been fatigued, having a cold he can't shake    HPI Christopher Thomas is a 58 y.o. male with past medical history of hypertension, prediabetes, and GERD presents for f/u of  chronic medical conditions. For the details of today's visit, please refer to the assessment and plan.    Past Medical History:  Diagnosis Date   GERD (gastroesophageal reflux disease) 08/17/2022   History of kidney stones    Hyperlipidemia LDL goal <100 08/17/2022   Hypertension    Kidney stones    Kidney stones 11/05/2016    Past Surgical History:  Procedure Laterality Date   APPENDECTOMY     CHOLECYSTECTOMY     COLONOSCOPY WITH PROPOFOL  N/A 10/28/2020   Procedure: COLONOSCOPY WITH PROPOFOL ;  Surgeon: Vinetta Greening, DO;  [facility]: [facility];  Service: Endoscopy;  Laterality: N/A;  9:00am   lithrotripsy     POLYPECTOMY  10/28/2020   Procedure: POLYPECTOMY INTESTINAL;  Surgeon: Vinetta Greening, DO;  [facility]: [facility];  Service: Endoscopy;;    Family History  Problem Relation Age of Onset   COPD Mother    Hypertension Mother    Alcohol abuse Mother    Asthma Mother    Heart disease Mother        died at 37   Hypertension Father    Stroke Sister    Alcohol abuse Sister    Cancer Brother    Early death Brother        homicide   Colon cancer Paternal Grandmother        in her 69s    Social History   Socioeconomic History   Marital status: Significant Other    Spouse name: Pam   Number of children: 1   Years of education: 12   Highest education level: Not on file  Occupational History   Occupation: Games developer    Comment: [employer]  Tobacco Use   Smoking status: Never   Smokeless tobacco: Never  Vaping Use   Vaping  status: Never Used  Substance and Sexual Activity   Alcohol use: Yes    Comment: occasionally- maybe one drink per week   Drug use: No   Sexual activity: Yes    Birth control/protection: None  Other Topics Concern   Not on file  Social History Narrative   Lives alone   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 16 g 6   omeprazole  (PRILOSEC) 20 MG capsule Take 1 capsule (20 mg total) by mouth daily. 90 capsule 1   tamsulosin  (FLOMAX ) 0.4 MG CAPS capsule Take 1 capsule (0.4 mg total) by mouth daily. 90 capsule 1  Vitamin D , Ergocalciferol , (DRISDOL ) 1.25 MG (50000 UNIT) CAPS capsule Take 1 capsule (50,000 Units total) by mouth every 7 (seven) days. 20 capsule 1   Olmesartan -amLODIPine -HCTZ 40-10-25 MG TABS Take 1 tablet by mouth daily. 30 tablet 0   No facility-administered medications prior to visit.    Allergies  Allergen Reactions   Lisinopril Cough    ROS Review of Systems  Constitutional:  Negative for fatigue and fever.  Eyes:  Negative for visual disturbance.  Respiratory:  Positive for cough, shortness of breath and wheezing. Negative for chest tightness.   Cardiovascular:  Negative for chest pain and palpitations.  Neurological:  Negative for dizziness and headaches.      Objective:     Physical Exam HENT:     Head: Normocephalic.     Right Ear: External ear normal.     Left Ear: External ear normal.     Nose: No congestion or rhinorrhea.     Mouth/Throat:     Mouth: Mucous  membranes are moist.  Cardiovascular:     Rate and Rhythm: Regular rhythm.     Heart sounds: No murmur heard. Pulmonary:     Effort: No respiratory distress.     Breath sounds: Normal breath sounds.  Neurological:     Mental Status: He is alert.     BP 137/81   Pulse 89   Ht 5' 6.5" (1.689 m)   Wt 261 lb (118.4 kg)   SpO2 94%   BMI 41.50 kg/m  Wt Readings from Last 3 Encounters:  10/17/23 261 lb (118.4 kg)  12/14/22 274 lb 1.9 oz (124.3 kg)  08/17/22 281 lb 0.6 oz (127.5 kg)    Lab Results  Component Value Date   TSH 0.976 12/14/2022   Lab Results  Component Value Date   WBC 11.5 (H) 12/14/2022   HGB 15.1 12/14/2022   HCT 47.9 12/14/2022   MCV 90 12/14/2022   PLT 203 12/14/2022   Lab Results  Component Value Date   NA 142 12/14/2022   K 3.8 12/14/2022   CO2 28 12/14/2022   GLUCOSE 76 12/14/2022   BUN 17 12/14/2022   CREATININE 1.20 12/14/2022   BILITOT 0.5 12/14/2022   ALKPHOS 81 12/14/2022   AST 23 12/14/2022   ALT 26 12/14/2022   PROT 7.0 12/14/2022   ALBUMIN 4.3 12/14/2022   CALCIUM 9.4 12/14/2022   EGFR 70 12/14/2022   Lab Results  Component Value Date   CHOL 149 12/14/2022   Lab Results  Component Value Date   HDL 50 12/14/2022   Lab Results  Component Value Date   LDLCALC 87 12/14/2022   Lab Results  Component Value Date   TRIG 57 12/14/2022   Lab Results  Component Value Date   CHOLHDL 3.0 12/14/2022   Lab Results  Component Value Date   HGBA1C 6.1 (H) 12/14/2022      Assessment & Plan:  Essential hypertension Assessment & Plan: Controlled Refilled olmesartan -amlodipine -HCTZ 40 - 10 - 25 mg daily No changes to the treatment regimen today Encouraged heart-healthy diet and increase physical activities BP Readings from Last 3 Encounters:  10/17/23 137/81  12/14/22 135/64  08/17/22 132/80    Orders: -     Olmesartan -amLODIPine -HCTZ; Take 1 tablet by mouth daily.  Dispense: 90 tablet; Refill: 1  Bronchitis Assessment &  Plan: The patient reports symptom onset 3 to 4 weeks ago and complains of increased cough, chest congestion, and wheezing. He denies fever or sore throat and reports coughing  up clear phlegm. Will initiate therapy with Prednisone 40 mg daily for 5 days to reduce inflammation in the lower airways. Promethazine DM has been ordered to help alleviate the cough. Encouraged to increase fluid intake and ensure adequate rest. Advised to gargle with warm salt water  3-4 times daily to relieve throat discomfort. Recommended the use of a humidifier at bedtime to help relieve cough and nasal congestion. Encouraged to follow up if symptoms do not improve.   Orders: -     Promethazine-DM; Take 5 mLs by mouth 4 (four) times daily as needed.  Dispense: 118 mL; Refill: 0 -     predniSONE; Take 2 tablets (40 mg total) by mouth daily for 5 days.  Dispense: 10 tablet; Refill: 0  Other fatigue Assessment & Plan: Pending labs. The patient is encouraged to maintain adherence to a well-balanced diet, increase physical activity, and practice proper sleep hygiene to support overall health.    Orders: -     B12 and Folate Panel  IFG (impaired fasting glucose) -     Hemoglobin A1c  Vitamin D  deficiency -     VITAMIN D  25 Hydroxy (Vit-D Deficiency, Fractures)  TSH (thyroid-stimulating hormone deficiency) -     TSH + free T4  Other hyperlipidemia -     Lipid panel -     CMP14+EGFR -     CBC with Differential/Platelet   Note: This chart has been completed using Engineer, civil (consulting) software, and while attempts have been made to ensure accuracy, certain words and phrases may not be transcribed as intended.   Follow-up: Return in about 4 months (around 02/16/2024).   Castin Donaghue, FNP

## 2023-10-17 NOTE — Patient Instructions (Addendum)
 I appreciate the opportunity to provide care to you today!    Follow up:  4 months  Labs: please stop by the lab during the week to get your blood drawn (CBC, CMP, TSH, Lipid profile, HgA1c, Vit D)   -Please start taking prednisone 40 mg daily for 5 days to reduce inflammation in your lower airways. -Promethazine DM has been ordered to help alleviate your cough; please take the medication as prescribed. -Increase your fluid intake and ensure you get plenty of rest. -You may use Tylenol  as needed for pain, fever, or general discomfort. -Gargle with warm salt water  3-4 times daily to help with throat pain or discomfort. -Using a humidifier at bedtime may also aid in relieving cough and nasal congestion. Please follow up if your symptoms do not improve.    Fatigue Here are evidence-based nonpharmacological interventions for fatigue, especially relevant for patients with chronic conditions, cancer-related fatigue, or general fatigue:  1. Physical Activity Regular, moderate exercise (e.g., walking, stretching, yoga) Shown to reduce fatigue and improve energy levels over time Tailored exercise programs (e.g., physical therapy) may be beneficial for deconditioned patients   2. Sleep Hygiene Maintain a consistent sleep schedule Avoid caffeine or electronics before bedtime Create a quiet, dark, and comfortable sleeping environment  3. Energy Conservation Techniques Prioritize tasks and pace activities throughout the day Alternate periods of activity with rest Use assistive devices when needed (e.g., walkers, shower chairs)  4. Nutritional Support Eat small, frequent, balanced meals throughout the day Stay hydrated Address any underlying nutrient deficiencies (e.g., iron, B12)  5. Stress Management Use relaxation techniques: deep breathing, meditation, or guided imagery Consider counseling or cognitive behavioral therapy (CBT) if emotional distress is contributing to fatigue     Please follow up if your symptoms worsen or fail to improve.    Please continue to a heart-healthy diet and increase your physical activities. Try to exercise for at least five days a week.    It was a pleasure to see you and I look forward to continuing to work together on your health and well-being. Please do not hesitate to call the office if you need care or have questions about your care.  In case of emergency, please visit the Emergency Department for urgent care, or contact our clinic at [phone] to schedule an appointment. We're here to help you!   Have a wonderful day and week. With Gratitude, Xandra Laramee MSN, FNP-BC

## 2023-10-17 NOTE — Assessment & Plan Note (Signed)
 Controlled Refilled olmesartan -amlodipine -HCTZ 40 - 10 - 25 mg daily No changes to the treatment regimen today Encouraged heart-healthy diet and increase physical activities BP Readings from Last 3 Encounters:  10/17/23 137/81  12/14/22 135/64  08/17/22 132/80

## 2023-10-17 NOTE — Assessment & Plan Note (Signed)
 The patient reports symptom onset 3 to 4 weeks ago and complains of increased cough, chest congestion, and wheezing. He denies fever or sore throat and reports coughing up clear phlegm. Will initiate therapy with Prednisone 40 mg daily for 5 days to reduce inflammation in the lower airways. Promethazine DM has been ordered to help alleviate the cough. Encouraged to increase fluid intake and ensure adequate rest. Advised to gargle with warm salt water  3-4 times daily to relieve throat discomfort. Recommended the use of a humidifier at bedtime to help relieve cough and nasal congestion. Encouraged to follow up if symptoms do not improve.

## 2023-10-18 LAB — CMP14+EGFR
ALT: 33 IU/L (ref 0–44)
AST: 29 IU/L (ref 0–40)
Albumin: 4.4 g/dL (ref 3.8–4.9)
Alkaline Phosphatase: 84 IU/L (ref 44–121)
BUN/Creatinine Ratio: 11 (ref 9–20)
BUN: 17 mg/dL (ref 6–24)
Bilirubin Total: 0.4 mg/dL (ref 0.0–1.2)
CO2: 25 mmol/L (ref 20–29)
Calcium: 9.3 mg/dL (ref 8.7–10.2)
Chloride: 104 mmol/L (ref 96–106)
Creatinine, Ser: 1.52 mg/dL — ABNORMAL HIGH (ref 0.76–1.27)
Globulin, Total: 2.5 g/dL (ref 1.5–4.5)
Glucose: 86 mg/dL (ref 70–99)
Potassium: 3.8 mmol/L (ref 3.5–5.2)
Sodium: 144 mmol/L (ref 134–144)
Total Protein: 6.9 g/dL (ref 6.0–8.5)
eGFR: 52 mL/min/{1.73_m2} — ABNORMAL LOW (ref >59)

## 2023-10-18 LAB — B12 AND FOLATE PANEL
Folate: 7.9 ng/mL (ref >3.0)
Vitamin B-12: 578 pg/mL (ref 232–1245)

## 2023-10-18 LAB — LIPID PANEL
Chol/HDL Ratio: 3.6 ratio (ref 0.0–5.0)
Cholesterol, Total: 159 mg/dL (ref 100–199)
HDL: 44 mg/dL (ref >39)
LDL Chol Calc (NIH): 98 mg/dL (ref 0–99)
Triglycerides: 91 mg/dL (ref 0–149)
VLDL Cholesterol Cal: 17 mg/dL (ref 5–40)

## 2023-10-18 LAB — VITAMIN D 25 HYDROXY (VIT D DEFICIENCY, FRACTURES): Vit D, 25-Hydroxy: 35.6 ng/mL (ref 30.0–100.0)

## 2023-10-18 LAB — CBC WITH DIFFERENTIAL/PLATELET
Basophils Absolute: 0.1 10*3/uL (ref 0.0–0.2)
Basos: 1 %
EOS (ABSOLUTE): 0.2 10*3/uL (ref 0.0–0.4)
Eos: 2 %
Hematocrit: 47.8 % (ref 37.5–51.0)
Hemoglobin: 15 g/dL (ref 13.0–17.7)
Immature Grans (Abs): 0 10*3/uL (ref 0.0–0.1)
Immature Granulocytes: 0 %
Lymphocytes Absolute: 2.7 10*3/uL (ref 0.7–3.1)
Lymphs: 30 %
MCH: 28.7 pg (ref 26.6–33.0)
MCHC: 31.4 g/dL — ABNORMAL LOW (ref 31.5–35.7)
MCV: 92 fL (ref 79–97)
Monocytes Absolute: 0.7 10*3/uL (ref 0.1–0.9)
Monocytes: 8 %
Neutrophils Absolute: 5.3 10*3/uL (ref 1.4–7.0)
Neutrophils: 59 %
Platelets: 208 10*3/uL (ref 150–450)
RBC: 5.22 x10E6/uL (ref 4.14–5.80)
RDW: 13.7 % (ref 11.6–15.4)
WBC: 9 10*3/uL (ref 3.4–10.8)

## 2023-10-18 LAB — TSH+FREE T4
Free T4: 1.41 ng/dL (ref 0.82–1.77)
TSH: 0.695 u[IU]/mL (ref 0.450–4.500)

## 2023-10-18 LAB — HEMOGLOBIN A1C
Est. average glucose Bld gHb Est-mCnc: 111 mg/dL
Hgb A1c MFr Bld: 5.5 % (ref 4.8–5.6)

## 2023-10-19 ENCOUNTER — Encounter: Payer: Self-pay | Admitting: Family Medicine

## 2023-11-03 ENCOUNTER — Ambulatory Visit: Payer: Self-pay | Admitting: Family Medicine

## 2023-11-29 ENCOUNTER — Telehealth: Payer: Self-pay | Admitting: Family Medicine

## 2023-11-29 NOTE — Telephone Encounter (Signed)
 [place] [phone] funeral home call. DOD Dec 10, 2023

## 2023-12-01 NOTE — Telephone Encounter (Signed)
 Patient chart updated.

## 2023-12-01 NOTE — Telephone Encounter (Unsigned)
 Copied from CRM [id number]. Topic: General - Other >> Dec 01, 2023 11:17 AM Tobias CROME wrote: Reason for CRM: Velinda Robert with [facility] will be sending a death certificate for Gloria Zarwolo, FNP to sign. Tim will be sending certificate via mail to address for office.   Requesting for it to be signed and mailed back to him.

## 2023-12-08 ENCOUNTER — Telehealth: Payer: Self-pay

## 2023-12-08 NOTE — Telephone Encounter (Signed)
 Copied from CRM [id number]. Topic: General - Deceased Patient >> 12-18-23  4:05 PM Delon DASEN wrote: Name of caller: Evalene Robert  Date of death: Dec 16, 2023   Name of funeral home: [facility]  Phone number of funeral home: [phone]  Provider that needs to sign form: Gloria Zarwolo  Timeline for signing: ASAP

## 2023-12-09 NOTE — Telephone Encounter (Unsigned)
 Copied from CRM [id number]. Topic: General - Other >> Dec 09, 2023 10:42 AM Donee H wrote: Reason for CRM: Evalene Robert calling regarding death certificate for patient. He received it but it does not have provide medical number listed. He is stating unable to file at [facility] Department without medical number. Please follow back up with him with this information. [phone]

## 2023-12-09 NOTE — Telephone Encounter (Signed)
 Spoke with Christopher Thomas and provided medical number.

## 2023-12-09 NOTE — Telephone Encounter (Signed)
 Paper death cert was received via mail on 7/28. Provider completed cert and per Mr. Womack's instructions, the cert was mailed back to [facility] on 7/28

## 2023-12-09 DEATH — deceased

## 2024-02-16 ENCOUNTER — Ambulatory Visit: Admitting: Family Medicine
